# Patient Record
Sex: Male | Born: 2000 | Race: White | Hispanic: No | Marital: Single | Smoking: Never smoker
Health system: Southern US, Community
[De-identification: ages and names within clinical notes are randomized; demographics above are authoritative.]

## PROBLEM LIST (undated history)

## (undated) DIAGNOSIS — K5190 Ulcerative colitis, unspecified, without complications: Secondary | ICD-10-CM

## (undated) DIAGNOSIS — J45909 Unspecified asthma, uncomplicated: Secondary | ICD-10-CM

---
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
## 2021-06-07 ENCOUNTER — Encounter (HOSPITAL_COMMUNITY): Payer: Self-pay | Admitting: Emergency Medicine

## 2021-06-07 ENCOUNTER — Emergency Department (HOSPITAL_COMMUNITY)
Admission: EM | Admit: 2021-06-07 | Discharge: 2021-06-07 | Disposition: A | Discharge status: Home / Self Care | Payer: BC Managed Care – PPO | Attending: Emergency Medicine | Admitting: Emergency Medicine

## 2021-06-07 DIAGNOSIS — E860 Dehydration: Secondary | ICD-10-CM | POA: Diagnosis not present

## 2021-06-07 DIAGNOSIS — R109 Unspecified abdominal pain: Secondary | ICD-10-CM

## 2021-06-07 DIAGNOSIS — R1084 Generalized abdominal pain: Secondary | ICD-10-CM | POA: Insufficient documentation

## 2021-06-07 DIAGNOSIS — Z20822 Contact with and (suspected) exposure to covid-19: Secondary | ICD-10-CM | POA: Diagnosis not present

## 2021-06-07 DIAGNOSIS — R112 Nausea with vomiting, unspecified: Secondary | ICD-10-CM | POA: Insufficient documentation

## 2021-06-07 DIAGNOSIS — R197 Diarrhea, unspecified: Secondary | ICD-10-CM | POA: Diagnosis not present

## 2021-06-07 HISTORY — DX: Ulcerative colitis, unspecified, without complications: K51.90

## 2021-06-07 LAB — COMPREHENSIVE METABOLIC PANEL
ALT: 12 U/L (ref 0–44)
AST: 19 U/L (ref 15–41)
Albumin: 4.7 g/dL (ref 3.5–5.0)
Alkaline Phosphatase: 88 U/L (ref 38–126)
Anion gap: 8 (ref 5–15)
BUN: 11 mg/dL (ref 6–20)
CO2: 25 mmol/L (ref 22–32)
Calcium: 9.3 mg/dL (ref 8.9–10.3)
Chloride: 104 mmol/L (ref 98–111)
Creatinine, Ser: 1.09 mg/dL (ref 0.61–1.24)
GFR, Estimated: >60 mL/min (ref >60)
Glucose, Bld: 116 mg/dL — ABNORMAL HIGH (ref 70–99)
Potassium: 4 mmol/L (ref 3.5–5.1)
Sodium: 137 mmol/L (ref 135–145)
Total Bilirubin: 1.7 mg/dL — ABNORMAL HIGH (ref 0.3–1.2)
Total Protein: 7.9 g/dL (ref 6.5–8.1)

## 2021-06-07 LAB — CBC
HCT: 43.2 % (ref 39.0–52.0)
Hemoglobin: 15.4 g/dL (ref 13.0–17.0)
MCH: 29.2 pg (ref 26.0–34.0)
MCHC: 35.6 g/dL (ref 30.0–36.0)
MCV: 81.8 fL (ref 80.0–100.0)
Platelets: 260 10*3/uL (ref 150–400)
RBC: 5.28 MIL/uL (ref 4.22–5.81)
RDW: 12.2 % (ref 11.5–15.5)
WBC: 7.2 10*3/uL (ref 4.0–10.5)
nRBC: 0 % (ref 0.0–0.2)

## 2021-06-07 LAB — URINALYSIS, ROUTINE W REFLEX MICROSCOPIC
Bilirubin Urine: NEGATIVE
Glucose, UA: NEGATIVE mg/dL
Hgb urine dipstick: NEGATIVE
Ketones, ur: 20 mg/dL — AB
Leukocytes,Ua: NEGATIVE
Nitrite: NEGATIVE
Protein, ur: 30 mg/dL — AB
Specific Gravity, Urine: 1.031 — ABNORMAL HIGH (ref 1.005–1.030)
pH: 5 (ref 5.0–8.0)

## 2021-06-07 LAB — RESP PANEL BY RT-PCR (FLU A&B, COVID) ARPGX2
Influenza A by PCR: NEGATIVE
Influenza B by PCR: NEGATIVE
SARS Coronavirus 2 by RT PCR: NEGATIVE

## 2021-06-07 LAB — LIPASE, BLOOD: Lipase: 25 U/L (ref 11–51)

## 2021-06-07 MED ORDER — SODIUM CHLORIDE 0.9 % IV BOLUS
1000.0000 mL | Freq: Once | INTRAVENOUS | Status: AC
  Administered 2021-06-07: 1000 mL via INTRAVENOUS

## 2021-06-07 MED ORDER — ALUM & MAG HYDROXIDE-SIMETH 200-200-20 MG/5ML PO SUSP
30.0000 mL | Freq: Once | ORAL | Status: AC
  Administered 2021-06-07: 30 mL via ORAL
  Filled 2021-06-07: qty 30

## 2021-06-07 MED ORDER — FAMOTIDINE IN NACL 20-0.9 MG/50ML-% IV SOLN
20.0000 mg | Freq: Once | INTRAVENOUS | Status: AC
  Administered 2021-06-07: 20 mg via INTRAVENOUS
  Filled 2021-06-07: qty 50

## 2021-06-07 MED ORDER — ONDANSETRON HCL 4 MG/2ML IJ SOLN
4.0000 mg | Freq: Once | INTRAMUSCULAR | Status: AC
Start: 2021-06-07 — End: 2021-06-07
  Administered 2021-06-07: 4 mg via INTRAVENOUS
  Filled 2021-06-07: qty 2

## 2021-06-07 MED ORDER — LACTATED RINGERS IV BOLUS
1000.0000 mL | Freq: Once | INTRAVENOUS | Status: AC
  Administered 2021-06-07: 1000 mL via INTRAVENOUS

## 2021-06-07 MED ORDER — LIDOCAINE VISCOUS HCL 2 % MT SOLN
15.0000 mL | Freq: Once | OROMUCOSAL | Status: AC
  Administered 2021-06-07: 15 mL via ORAL
  Filled 2021-06-07: qty 15

## 2021-06-07 MED ORDER — SUCRALFATE 1 G PO TABS: 1.0000 g | ORAL_TABLET | Freq: Three times a day (TID) | ORAL | 0 refills | Status: DC

## 2021-06-07 MED ORDER — KETOROLAC TROMETHAMINE 15 MG/ML IJ SOLN
15.0000 mg | Freq: Once | INTRAMUSCULAR | Status: AC
  Administered 2021-06-07: 15 mg via INTRAVENOUS
  Filled 2021-06-07: qty 1

## 2021-06-07 MED ORDER — ONDANSETRON HCL 4 MG/2ML IJ SOLN
4.0000 mg | Freq: Once | INTRAMUSCULAR | Status: AC
  Administered 2021-06-07: 4 mg via INTRAVENOUS
  Filled 2021-06-07: qty 2

## 2021-06-07 MED ORDER — ONDANSETRON HCL 4 MG PO TABS: 4.0000 mg | ORAL_TABLET | ORAL | 0 refills | Status: DC | PRN

## 2021-06-07 NOTE — ED Provider Triage Note (Signed)
Emergency Medicine Provider Triage Evaluation Note  Gregory Ruiz , a 21 y.o. male  was evaluated in triage.  Pt complains of rectal bleeding over the last several days.  Patient does have a history of ulcerative colitis and is currently on mesalamine.  He was instructed to increase his dosage over the weekend and started prednisone which she took this morning but vomited back up.  He is having abdominal pain and bright red diarrhea.  Review of Systems  Positive:  Negative: See above   Physical Exam  BP 137/72 (BP Location: Left Arm)    Pulse 93    Temp 98.6 F (37 C) (Oral)    Resp 16    Ht 5\' 7"  (1.702 m)    Wt 59 kg    SpO2 100%    BMI 20.36 kg/m  Gen:   Awake, no distress   Resp:  Normal effort  MSK:   Moves extremities without difficulty  Other:    Medical Decision Making  Medically screening exam initiated at 11:57 AM.  Appropriate orders placed.  Gregory Ruiz was informed that the remainder of the evaluation will be completed by another provider, this initial triage assessment does not replace that evaluation, and the importance of remaining in the ED until their evaluation is complete.     Myna Bright [place], [place] 06/07/21 1159

## 2021-06-07 NOTE — ED Triage Notes (Signed)
Per patient, history of Ulcerative Colitis-states he was placed on new med but unable to take due to vomiting-states flare up since this past Friday

## 2021-06-07 NOTE — Discharge Instructions (Addendum)
It was a pleasure caring for you today in the emergency department.  Please return to the emergency department for any worsening or worrisome symptoms.   You should return to the hospital if you experience return of persistent nausea and vomiting that does not resolve and does not allow you to tolerate any food or fluids, persistent fevers for greater than 2-3 more days, increasing abdominal pain that persists despite medications, persistent diarrhea, dizziness, syncope (fainting), or for any other concerns.    Please return to the emergency department immediately for any new or concerning symptoms, or if you get worse.   

## 2021-06-07 NOTE — ED Provider Notes (Signed)
[facility] DEPT Provider Note   CSN: [id number] Arrival date & time: 06/07/21  1108     History  Chief Complaint  Patient presents with   Diarrhea   Emesis    Sparrow [place] is a 21 y.o. male.  This is a 21 y.o. male  with significant medical history as below, including UC who presents to the ED with complaint of abdominal pain, nausea, diarrhea, blood per rectum.  Poor P.o.  Location:  generalized pain Duration:  5 days Onset:  gradual Timing:  constant Description:  cramping abd pain Severity:  mild Exacerbating/Alleviating Factors:  worse when he attempts PO Associated Symptoms:  see above Pertinent Negatives:  no fevers or chills, cp or dib, no urine changes, no THC, no etoh  Context: diagnosed with UC around 3-4 mos ago with GI, started on po steroids at onset of symptoms. Unable to take this am 2/2 emesis     Past Medical History: No date: Ulcerative colitis ([place])  History reviewed. No pertinent surgical history.    The history is provided by the patient and a relative. No language interpreter was used.  Diarrhea Associated symptoms: abdominal pain and vomiting   Associated symptoms: no chills, no fever and no headaches   Emesis Associated symptoms: abdominal pain and diarrhea   Associated symptoms: no chills, no cough, no fever and no headaches       Home Medications Prior to Admission medications   Medication Sig Start Date End Date Taking? Authorizing Provider  ondansetron (ZOFRAN) 4 MG tablet Take 1 tablet (4 mg total) by mouth every 4 (four) hours as needed for nausea or vomiting. 06/07/21  Yes Wynona Dove A, DO  sucralfate (CARAFATE) 1 g tablet Take 1 tablet (1 g total) by mouth 4 (four) times daily -  with meals and at bedtime for 7 days. 06/07/21 06/14/21 Yes Jeanell Sparrow, DO      Allergies    Patient has no allergy information on record.    Review of Systems   Review of Systems  Constitutional:  Negative for chills  and fever.  HENT:  Negative for facial swelling and trouble swallowing.   Eyes:  Negative for photophobia and visual disturbance.  Respiratory:  Negative for cough and shortness of breath.   Cardiovascular:  Negative for chest pain and palpitations.  Gastrointestinal:  Positive for abdominal pain, blood in stool, diarrhea, nausea and vomiting.  Endocrine: Negative for polydipsia and polyuria.  Genitourinary:  Negative for difficulty urinating and hematuria.  Musculoskeletal:  Negative for gait problem and joint swelling.  Skin:  Negative for pallor and rash.  Neurological:  Negative for syncope and headaches.  Psychiatric/Behavioral:  Negative for agitation and confusion.    Physical Exam Updated Vital Signs BP 103/65    Pulse 86    Temp 98.3 F (36.8 C) (Oral)    Resp 18    Ht 5\' 7"  (1.702 m)    Wt 59 kg    SpO2 98%    BMI 20.36 kg/m  Physical Exam Vitals and nursing note reviewed.  Constitutional:      General: He is not in acute distress.    Appearance: Normal appearance. He is well-developed. He is not ill-appearing, toxic-appearing or diaphoretic.  HENT:     Head: Normocephalic and atraumatic.     Right Ear: External ear normal.     Left Ear: External ear normal.     Mouth/Throat:     Mouth: Mucous membranes are moist.  Eyes:     General: No scleral icterus. Cardiovascular:     Rate and Rhythm: Normal rate and regular rhythm.     Pulses: Normal pulses.     Heart sounds: Normal heart sounds.  Pulmonary:     Effort: Pulmonary effort is normal. No respiratory distress.     Breath sounds: Normal breath sounds.  Abdominal:     General: Abdomen is flat.     Palpations: Abdomen is soft.     Tenderness: There is no abdominal tenderness.  Musculoskeletal:        General: Normal range of motion.     Cervical back: Normal range of motion.     Right lower leg: No edema.     Left lower leg: No edema.  Skin:    General: Skin is warm and dry.     Capillary Refill: Capillary  refill takes less than 2 seconds.     Comments: Appropriate skin turgor   Neurological:     Mental Status: He is alert and oriented to person, place, and time.     GCS: GCS eye subscore is 4. GCS verbal subscore is 5. GCS motor subscore is 6.  Psychiatric:        Mood and Affect: Mood normal.        Behavior: Behavior normal.    ED Results / Procedures / Treatments   Labs (all labs ordered are listed, but only abnormal results are displayed) Labs Reviewed  COMPREHENSIVE METABOLIC PANEL - Abnormal; Notable for the following components:      Result Value   Glucose, Bld 116 (*)    Total Bilirubin 1.7 (*)    All other components within normal limits  URINALYSIS, ROUTINE W REFLEX MICROSCOPIC - Abnormal; Notable for the following components:   Specific Gravity, Urine 1.031 (*)    Ketones, ur 20 (*)    Protein, ur 30 (*)    Bacteria, UA RARE (*)    All other components within normal limits  RESP PANEL BY RT-PCR (FLU A&B, COVID) ARPGX2  LIPASE, BLOOD  CBC    EKG None  Radiology No results found.  Procedures .Critical Care Performed by: Jeanell Sparrow, DO Authorized by: Jeanell Sparrow, DO   Critical care provider statement:    Critical care time (minutes):  30   Critical care time was exclusive of:  Separately billable procedures and treating other patients   Critical care was necessary to treat or prevent imminent or life-threatening deterioration of the following conditions:  Dehydration   Critical care was time spent personally by me on the following activities:  Development of treatment plan with patient or surrogate, discussions with consultants, evaluation of patient's response to treatment, examination of patient, ordering and review of laboratory studies, ordering and review of radiographic studies, ordering and performing treatments and interventions, pulse oximetry, re-evaluation of patient's condition, review of old charts and obtaining history from patient or surrogate     Medications Ordered in ED Medications  sodium chloride 0.9 % bolus 1,000 mL (0 mLs Intravenous Stopped 06/07/21 1802)  ondansetron (ZOFRAN) injection 4 mg (4 mg Intravenous Given 06/07/21 1651)  famotidine (PEPCID) IVPB 20 mg premix (0 mg Intravenous Stopped 06/07/21 1802)  alum & mag hydroxide-simeth (MAALOX/MYLANTA) 200-200-20 MG/5ML suspension 30 mL (30 mLs Oral Given 06/07/21 1648)    And  lidocaine (XYLOCAINE) 2 % viscous mouth solution 15 mL (15 mLs Oral Given 06/07/21 1648)  ketorolac (TORADOL) 15 MG/ML injection 15 mg (15 mg Intravenous Given  06/07/21 1650)  ondansetron (ZOFRAN) injection 4 mg (4 mg Intravenous Given 06/07/21 2136)  lactated ringers bolus 1,000 mL (0 mLs Intravenous Stopped 06/07/21 2304)    ED Course/ Medical Decision Making/ A&P                           Medical Decision Making Amount and/or Complexity of Data Reviewed Labs: ordered.  Risk OTC drugs. Prescription drug management.    CC: abd pain, diarrhea, n/v  This patient presents to the Emergency Department for the above complaint. This involves an extensive number of treatment options and is a complaint that carries with it a high risk of complications and morbidity. Vital signs were reviewed. Serious etiologies considered.  Abd is soft, non-tender, not peritoneal.   Record review:  Previous records obtained and reviewed   Additional history obtained from mother  Medical and surgical history as noted above.   Work up as above, notable for:   Lab results that were available during my care of the patient were reviewed by me and considered in my medical decision making.   Cardiac monitoring reviewed and interpreted personally which shows NSR  Social determinants of health include - N/a  Management: Gi cocktail, IVF  Reassessment:  Feeling better, tolerating PO. Rpt abd exam is soft, non-tender.    Patient presents with vomiting, diarrhea, and abdominal cramping. Sx suggestive of enteritis or  food born illness vs flare of underling UC. Surgical or other more serious etiology appears very unlikely. The patient is improved with ED treatment. Will discharge with observation and symptomatic treatment. Abdominal pain warnings discussed.  F/u with PCP and GI  The patient improved significantly and was discharged in stable condition. Detailed discussions were had with the patient regarding current findings, and need for close f/u with PCP or on call doctor. The patient has been instructed to return immediately if the symptoms worsen in any way for re-evaluation. Patient verbalized understanding and is in agreement with current care plan. All questions answered prior to discharge.      This chart was dictated using voice recognition software.  Despite best efforts to proofread,  errors can occur which can change the documentation meaning.         Final Clinical Impression(s) / ED Diagnoses Final diagnoses:  Dehydration  Nausea vomiting and diarrhea  Abdominal pain, unspecified abdominal location    Rx / DC Orders ED Discharge Orders          Ordered    sucralfate (CARAFATE) 1 g tablet  3 times daily with meals & bedtime        06/07/21 2213    ondansetron (ZOFRAN) 4 MG tablet  Every 4 hours PRN        06/07/21 2213              Wynona Dove A, DO 06/08/21 0100

## 2021-06-10 ENCOUNTER — Other Ambulatory Visit: Payer: Self-pay | Admitting: Gastroenterology

## 2021-06-10 ENCOUNTER — Ambulatory Visit
Admission: RE | Admit: 2021-06-10 | Discharge: 2021-06-10 | Disposition: A | Discharge status: Home / Self Care | Payer: BC Managed Care – PPO | Source: Ambulatory Visit | Attending: Gastroenterology | Admitting: Gastroenterology

## 2021-06-10 DIAGNOSIS — R110 Nausea: Secondary | ICD-10-CM

## 2021-06-11 ENCOUNTER — Other Ambulatory Visit: Payer: Self-pay

## 2021-06-11 ENCOUNTER — Emergency Department (HOSPITAL_COMMUNITY): Payer: BC Managed Care – PPO

## 2021-06-11 ENCOUNTER — Inpatient Hospital Stay (HOSPITAL_COMMUNITY)
Admission: EM | Admit: 2021-06-11 | Discharge: 2021-06-24 | DRG: 386 | Disposition: A | Discharge status: Home / Self Care | Payer: BC Managed Care – PPO | Attending: Family Medicine | Admitting: Family Medicine

## 2021-06-11 ENCOUNTER — Encounter (HOSPITAL_COMMUNITY): Payer: Self-pay

## 2021-06-11 DIAGNOSIS — Z880 Allergy status to penicillin: Secondary | ICD-10-CM

## 2021-06-11 DIAGNOSIS — R300 Dysuria: Secondary | ICD-10-CM

## 2021-06-11 DIAGNOSIS — Z79899 Other long term (current) drug therapy: Secondary | ICD-10-CM

## 2021-06-11 DIAGNOSIS — N3090 Cystitis, unspecified without hematuria: Secondary | ICD-10-CM

## 2021-06-11 DIAGNOSIS — K5190 Ulcerative colitis, unspecified, without complications: Secondary | ICD-10-CM | POA: Diagnosis present

## 2021-06-11 DIAGNOSIS — R000 Tachycardia, unspecified: Secondary | ICD-10-CM

## 2021-06-11 DIAGNOSIS — E860 Dehydration: Secondary | ICD-10-CM | POA: Diagnosis present

## 2021-06-11 DIAGNOSIS — K51819 Other ulcerative colitis with unspecified complications: Secondary | ICD-10-CM

## 2021-06-11 DIAGNOSIS — J4520 Mild intermittent asthma, uncomplicated: Secondary | ICD-10-CM | POA: Diagnosis present

## 2021-06-11 DIAGNOSIS — Z7952 Long term (current) use of systemic steroids: Secondary | ICD-10-CM

## 2021-06-11 DIAGNOSIS — Z20822 Contact with and (suspected) exposure to covid-19: Secondary | ICD-10-CM | POA: Diagnosis present

## 2021-06-11 DIAGNOSIS — D500 Iron deficiency anemia secondary to blood loss (chronic): Secondary | ICD-10-CM

## 2021-06-11 DIAGNOSIS — K51011 Ulcerative (chronic) pancolitis with rectal bleeding: Principal | ICD-10-CM | POA: Diagnosis present

## 2021-06-11 DIAGNOSIS — Z111 Encounter for screening for respiratory tuberculosis: Secondary | ICD-10-CM

## 2021-06-11 DIAGNOSIS — J45909 Unspecified asthma, uncomplicated: Secondary | ICD-10-CM

## 2021-06-11 DIAGNOSIS — R17 Unspecified jaundice: Secondary | ICD-10-CM | POA: Diagnosis present

## 2021-06-11 HISTORY — DX: Unspecified asthma, uncomplicated: J45.909

## 2021-06-11 LAB — COMPREHENSIVE METABOLIC PANEL
ALT: 12 U/L (ref 0–44)
AST: 14 U/L — ABNORMAL LOW (ref 15–41)
Albumin: 4.3 g/dL (ref 3.5–5.0)
Alkaline Phosphatase: 72 U/L (ref 38–126)
Anion gap: 9 (ref 5–15)
BUN: 15 mg/dL (ref 6–20)
CO2: 28 mmol/L (ref 22–32)
Calcium: 9.3 mg/dL (ref 8.9–10.3)
Chloride: 103 mmol/L (ref 98–111)
Creatinine, Ser: 1.15 mg/dL (ref 0.61–1.24)
GFR, Estimated: >60 mL/min (ref >60)
Glucose, Bld: 98 mg/dL (ref 70–99)
Potassium: 3.6 mmol/L (ref 3.5–5.1)
Sodium: 140 mmol/L (ref 135–145)
Total Bilirubin: 1.6 mg/dL — ABNORMAL HIGH (ref 0.3–1.2)
Total Protein: 7.5 g/dL (ref 6.5–8.1)

## 2021-06-11 LAB — URINALYSIS, ROUTINE W REFLEX MICROSCOPIC
Bilirubin Urine: NEGATIVE
Glucose, UA: NEGATIVE mg/dL
Hgb urine dipstick: NEGATIVE
Ketones, ur: NEGATIVE mg/dL
Leukocytes,Ua: NEGATIVE
Nitrite: NEGATIVE
Protein, ur: NEGATIVE mg/dL
Specific Gravity, Urine: 1.01 (ref 1.005–1.030)
pH: 6 (ref 5.0–8.0)

## 2021-06-11 LAB — CBC WITH DIFFERENTIAL/PLATELET
Abs Immature Granulocytes: 0.03 10*3/uL (ref 0.00–0.07)
Basophils Absolute: 0 10*3/uL (ref 0.0–0.1)
Basophils Relative: 1 %
Eosinophils Absolute: 0.1 10*3/uL (ref 0.0–0.5)
Eosinophils Relative: 2 %
HCT: 43.5 % (ref 39.0–52.0)
Hemoglobin: 15.3 g/dL (ref 13.0–17.0)
Immature Granulocytes: 0 %
Lymphocytes Relative: 11 %
Lymphs Abs: 0.9 10*3/uL (ref 0.7–4.0)
MCH: 29 pg (ref 26.0–34.0)
MCHC: 35.2 g/dL (ref 30.0–36.0)
MCV: 82.5 fL (ref 80.0–100.0)
Monocytes Absolute: 1.3 10*3/uL — ABNORMAL HIGH (ref 0.1–1.0)
Monocytes Relative: 15 %
Neutro Abs: 5.9 10*3/uL (ref 1.7–7.7)
Neutrophils Relative %: 71 %
Platelets: 289 10*3/uL (ref 150–400)
RBC: 5.27 MIL/uL (ref 4.22–5.81)
RDW: 12.3 % (ref 11.5–15.5)
WBC: 8.3 10*3/uL (ref 4.0–10.5)
nRBC: 0 % (ref 0.0–0.2)

## 2021-06-11 LAB — RESP PANEL BY RT-PCR (FLU A&B, COVID) ARPGX2
Influenza A by PCR: NEGATIVE
Influenza B by PCR: NEGATIVE
SARS Coronavirus 2 by RT PCR: NEGATIVE

## 2021-06-11 LAB — C DIFFICILE QUICK SCREEN W PCR REFLEX
C Diff antigen: NEGATIVE
C Diff interpretation: NOT DETECTED
C Diff toxin: NEGATIVE

## 2021-06-11 LAB — LIPASE, BLOOD: Lipase: 26 U/L (ref 11–51)

## 2021-06-11 LAB — HEPATITIS B SURFACE ANTIGEN: Hepatitis B Surface Ag: NONREACTIVE

## 2021-06-11 MED ORDER — ACETAMINOPHEN 650 MG RE SUPP: 650.0000 mg | Freq: Four times a day (QID) | RECTAL | Status: DC | PRN

## 2021-06-11 MED ORDER — METHYLPREDNISOLONE SODIUM SUCC 40 MG IJ SOLR
40.0000 mg | Freq: Two times a day (BID) | INTRAMUSCULAR | Status: DC
  Administered 2021-06-11 – 2021-06-14 (×6): 40 mg via INTRAVENOUS
  Filled 2021-06-11 (×6): qty 1

## 2021-06-11 MED ORDER — SUCRALFATE 1 G PO TABS
1.0000 g | ORAL_TABLET | Freq: Three times a day (TID) | ORAL | Status: DC
  Administered 2021-06-11 – 2021-06-12 (×4): 1 g via ORAL
  Filled 2021-06-11 (×4): qty 1

## 2021-06-11 MED ORDER — LACTATED RINGERS IV SOLN: INTRAVENOUS | Status: DC

## 2021-06-11 MED ORDER — ONDANSETRON 4 MG PO TBDP
4.0000 mg | ORAL_TABLET | Freq: Once | ORAL | Status: AC
  Administered 2021-06-11: 4 mg via ORAL
  Filled 2021-06-11: qty 1

## 2021-06-11 MED ORDER — SODIUM CHLORIDE (PF) 0.9 % IJ SOLN
INTRAMUSCULAR | Status: AC
  Filled 2021-06-11: qty 50

## 2021-06-11 MED ORDER — SODIUM CHLORIDE 0.9 % IV SOLN
1.0000 g | Freq: Once | INTRAVENOUS | Status: AC
  Administered 2021-06-11: 1 g via INTRAVENOUS
  Filled 2021-06-11: qty 10

## 2021-06-11 MED ORDER — MESALAMINE 1.2 G PO TBEC
2.4000 g | DELAYED_RELEASE_TABLET | Freq: Two times a day (BID) | ORAL | Status: DC
  Administered 2021-06-11 – 2021-06-17 (×11): 2.4 g via ORAL
  Filled 2021-06-11 (×14): qty 2

## 2021-06-11 MED ORDER — POTASSIUM CHLORIDE CRYS ER 20 MEQ PO TBCR
40.0000 meq | EXTENDED_RELEASE_TABLET | Freq: Once | ORAL | Status: AC
  Administered 2021-06-11: 40 meq via ORAL
  Filled 2021-06-11: qty 2

## 2021-06-11 MED ORDER — IOHEXOL 300 MG/ML  SOLN
100.0000 mL | Freq: Once | INTRAMUSCULAR | Status: AC | PRN
  Administered 2021-06-11: 100 mL via INTRAVENOUS

## 2021-06-11 MED ORDER — ONDANSETRON HCL 4 MG/2ML IJ SOLN
4.0000 mg | Freq: Four times a day (QID) | INTRAMUSCULAR | Status: DC | PRN
  Administered 2021-06-12 – 2021-06-16 (×13): 4 mg via INTRAVENOUS
  Filled 2021-06-11 (×14): qty 2

## 2021-06-11 MED ORDER — SODIUM CHLORIDE 0.9 % IV SOLN
1.0000 g | INTRAVENOUS | Status: DC
  Administered 2021-06-12 – 2021-06-13 (×2): 1 g via INTRAVENOUS
  Filled 2021-06-11 (×3): qty 10

## 2021-06-11 MED ORDER — ENOXAPARIN SODIUM 40 MG/0.4ML IJ SOSY
40.0000 mg | PREFILLED_SYRINGE | INTRAMUSCULAR | Status: DC
  Filled 2021-06-11 (×2): qty 0.4

## 2021-06-11 MED ORDER — ACETAMINOPHEN 325 MG PO TABS: 650.0000 mg | ORAL_TABLET | Freq: Four times a day (QID) | ORAL | Status: DC | PRN

## 2021-06-11 MED ORDER — ONDANSETRON HCL 4 MG PO TABS: 4.0000 mg | ORAL_TABLET | Freq: Four times a day (QID) | ORAL | Status: DC | PRN

## 2021-06-11 MED ORDER — SODIUM CHLORIDE 0.9 % IV BOLUS
1000.0000 mL | Freq: Once | INTRAVENOUS | Status: AC
Start: 2021-06-11 — End: 2021-06-11
  Administered 2021-06-11: 1000 mL via INTRAVENOUS

## 2021-06-11 MED ORDER — ALBUTEROL SULFATE (2.5 MG/3ML) 0.083% IN NEBU: 2.5000 mg | INHALATION_SOLUTION | RESPIRATORY_TRACT | Status: DC | PRN

## 2021-06-11 MED ORDER — ENSURE ENLIVE PO LIQD
237.0000 mL | Freq: Two times a day (BID) | ORAL | Status: DC
  Administered 2021-06-12 (×2): 237 mL via ORAL

## 2021-06-11 NOTE — Assessment & Plan Note (Deleted)
He reports dysuria, and small amount of purulent drainage from penis/ UA; no WBC.  CT scan: Mild diffuse urinary bladder wall thickening. Follow urine culture, GC chlamydia and gonorrhea. Continue with IV ceftriaxone. Urinary symptoms improved.

## 2021-06-11 NOTE — Assessment & Plan Note (Addendum)
Some fluctuation, trend Follow outpatient

## 2021-06-11 NOTE — Assessment & Plan Note (Addendum)
Presented with UC flare CT with long segment bowe wall thickening with mucosal hyperenhancement involving the distal transverse colon, descending colon, sigmoid colon and rectum Negative c diff and GI pathogen panel from 2/11 Colonoscopy 2/17 with severe pancolitis ulcerative colitis 2/17 bx with focal acute colitis, active chronic colitis with architectural changes c/w ulcerative colitis, active chronic colitis with architectural changes c/w ulcerative colitis Transitioned to oral steroids - will discharge with 40 mg daily at discharge per GI, long term dosing per Dr. [name] S/p first dose of infliximib on 2/23 Appreciate Gi assistance

## 2021-06-11 NOTE — Progress Notes (Signed)
Patient IV was started in CT for exam. Per Denton Ar RN leave iv in and place patient in lobby. Patient instructed to tell triage if leaves.

## 2021-06-11 NOTE — Consult Note (Addendum)
[facility] Consult  Referring Provider: ER Primary Care Physician:  [name], MD Primary Gastroenterologist: Dr. Fabio [facility] gastroenter[facility]  Reason for Consultation: Rectal bleeding, burning micturition, diarrhea, nausea  HPI: Gregory Ruiz is a 21 y.o. male diagnosed with ulcerative colitis, 5/22, colonoscopy showed focal active colitis in cecum, mildly active chronic proctitis with a normal terminal ileum, ascending, transverse, descending and sigmoid. Patient was on mesalamine 1.2 g 2 pills a day since then with fair control of symptoms, however for the last 10 days has developed progressively worsening rectal bleeding, increased frequency of bowel movements, left-sided abdominal pain, nausea and painful/burning micturition. This is worsened, patient was not able to tolerate oral prednisone because of nausea and vomiting and noticed foul-smelling puslike discharge with urination. He was subsequently advised to proceed to ER where he had a CAT scan of the abdomen and pelvis with contrast which showed long segment bowel wall thickening with mucosal hyperenhancement-involving distal transverse, descending, sigmoid, rectum, without obstruction, associated with mild diffuse urinary bladder wall thickening representing cystitis. There was no obvious colovesical fistula. Stool studies negative for C. difficile while GI pathogen panel is pending.   Past Medical History:  Diagnosis Date   Asthma    Ulcerative colitis ([place])     History reviewed. No pertinent surgical history.  Prior to Admission medications   Medication Sig Start Date End Date Taking? Authorizing Provider  ondansetron (ZOFRAN) 4 MG tablet Take 1 tablet (4 mg total) by mouth every 4 (four) hours as needed for nausea or vomiting. 06/07/21   Jeanell Sparrow, DO  sucralfate (CARAFATE) 1 g tablet Take 1 tablet (1 g total) by mouth 4 (four) times daily -  with meals and at bedtime for 7 days. 06/07/21 06/14/21  Jeanell Sparrow, DO    Current Facility-Administered Medications  Medication Dose Route Frequency Provider Last Rate Last Admin   cefTRIAXone (ROCEPHIN) 1 g in sodium chloride 0.9 % 100 mL IVPB  1 g Intravenous Once Couture, Cortni S, PA-C       sodium chloride (PF) 0.9 % injection            Current Outpatient Medications  Medication Sig Dispense Refill   ondansetron (ZOFRAN) 4 MG tablet Take 1 tablet (4 mg total) by mouth every 4 (four) hours as needed for nausea or vomiting. 8 tablet 0   sucralfate (CARAFATE) 1 g tablet Take 1 tablet (1 g total) by mouth 4 (four) times daily -  with meals and at bedtime for 7 days. 28 tablet 0    Allergies as of 06/11/2021 - Review Complete 06/11/2021  Allergen Reaction Noted   Penicillins Rash 02/10/2015   Other Rash 02/10/2015    History reviewed. No pertinent family history.  Social History   Socioeconomic History   Marital status: Single    Spouse name: Not on file   Number of children: Not on file   Years of education: Not on file   Highest education level: Not on file  Occupational History   Not on file  Tobacco Use   Smoking status: Not on file   Smokeless tobacco: Not on file  Substance and Sexual Activity   Alcohol use: Not on file   Drug use: Not on file   Sexual activity: Not on file  Other Topics Concern   Not on file  Social History Narrative   Not on file   Social Determinants of Health   Financial Resource Strain: Not on file  Food Insecurity: Not on  file  Transportation Needs: Not on file  Physical Activity: Not on file  Stress: Not on file  Social Connections: Not on file  Intimate Partner Violence: Not on file    Review of Systems: Positive for: GI: Described in detail in HPI.    Gen: anorexia, fatigue, weakness, involuntary weight loss, denies any fever, chills, rigors, night sweats  and sleep disorder CV: Denies chest pain, angina, palpitations, syncope, orthopnea, PND, peripheral edema, and  claudication. Resp: Denies dyspnea, cough, sputum, wheezing, coughing up blood. GU : urinary burning, urinary frequency, urinary hesitancy, nocturnal urinationblood in urine, denies urinary incontinence. MS: Denies joint pain or swelling.  Denies muscle weakness, cramps, atrophy.  Derm: Denies rash, itching, oral ulcerations, hives, unhealing ulcers.  Psych: Denies depression, anxiety, memory loss, suicidal ideation, hallucinations,  and confusion. Heme: Denies bruising and enlarged lymph nodes. Neuro:  Denies any headaches, dizziness, paresthesias. Endo:  Denies any problems with DM, thyroid, adrenal function.  Physical Exam: Vital signs in last 24 hours: Temp:  [98.7 F (37.1 C)] 98.7 F (37.1 C) (02/11 0857) Pulse Rate:  [92-106] 106 (02/11 1340) Resp:  [16-18] 18 (02/11 1340) BP: (111-122)/(72-77) 114/72 (02/11 1340) SpO2:  [95 %-100 %] 99 % (02/11 1340)    General:   Alert,  Well-developed, thinly built, pleasant and cooperative in NAD Head:  Normocephalic and atraumatic. Eyes:  Sclera clear, no icterus.   Conjunctiva pink. Ears:  Normal auditory acuity. Nose:  No deformity, discharge,  or lesions. Mouth:  No deformity or lesions.  Oropharynx pink & moist. Neck:  Supple; no masses or thyromegaly. Lungs:  Clear throughout to auscultation.   No wheezes, crackles, or rhonchi. No acute distress. Heart:  Regular rate and rhythm; no murmurs, clicks, rubs,  or gallops. Extremities:  Without clubbing or edema. Neurologic:  Alert and  oriented x4;  grossly normal neurologically. Skin:  Intact without significant lesions or rashes. Psych:  Alert and cooperative. Normal mood and affect. Abdomen:  Soft, nontender and nondistended. No masses, hepatosplenomegaly or hernias noted. Normal bowel sounds, without guarding, and without rebound.         Lab Results: Recent Labs    06/11/21 0946  WBC 8.3  HGB 15.3  HCT 43.5  PLT 289   BMET Recent Labs    06/11/21 0946  NA 140  K  3.6  CL 103  CO2 28  GLUCOSE 98  BUN 15  CREATININE 1.15  CALCIUM 9.3   LFT Recent Labs    06/11/21 0946  PROT 7.5  ALBUMIN 4.3  AST 14*  ALT 12  ALKPHOS 72  BILITOT 1.6*   PT/INR No results for input(s): LABPROT, INR in the last 72 hours.  Studies/Results: CT ABDOMEN PELVIS W CONTRAST  Result Date: 06/11/2021 CLINICAL DATA:  Inflammatory bowel disease (IBD) concern for entero-vesical fistula EXAM: CT ABDOMEN AND PELVIS WITH CONTRAST TECHNIQUE: Multidetector CT imaging of the abdomen and pelvis was performed using the standard protocol following bolus administration of intravenous contrast. RADIATION DOSE REDUCTION: This exam was performed according to the departmental dose-optimization program which includes automated exposure control, adjustment of the mA and/or kV according to patient size and/or use of iterative reconstruction technique. CONTRAST:  140mL OMNIPAQUE IOHEXOL 300 MG/ML  SOLN COMPARISON:  12/15/2020 FINDINGS: Lower chest: Lung bases are clear.  Heart size is normal. Hepatobiliary: No focal liver abnormality is seen. No gallstones, gallbladder wall thickening, or biliary dilatation. Pancreas: Unremarkable. No pancreatic ductal dilatation or surrounding inflammatory changes. Spleen: Normal in size without  focal abnormality. Adrenals/Urinary Tract: Unremarkable adrenal glands. Kidneys enhance symmetrically without focal lesion, stone, or hydronephrosis. Ureters are nondilated. Mild diffuse urinary bladder wall thickening. No air is seen within the bladder lumen. No obvious colovesical fistula. Stomach/Bowel: Long segment bowel wall thickening with mucosal hyperenhancement involving the distal transverse colon, descending colon, sigmoid colon, and rectum. Normal appendix in the right lower quadrant. No dilated loops of bowel to suggest obstruction. Stomach within normal limits. Vascular/Lymphatic: No significant vascular findings are present. No enlarged abdominal or pelvic lymph  nodes. Reproductive: Prostate is unremarkable. Other: No free fluid. No abdominopelvic fluid collection. No pneumoperitoneum. No abdominal wall hernia. Musculoskeletal: No acute or significant osseous findings. IMPRESSION: 1. Long segment bowel wall thickening with mucosal hyperenhancement involving the distal transverse colon, descending colon, sigmoid colon, and rectum. Findings suggest active bowel inflammation in the setting of known inflammatory bowel disease. 2. Mild diffuse urinary bladder wall thickening, which may represent cystitis. No obvious colovesical fistula. Correlate with urinalysis. If further imaging evaluation is warranted, a CT pelvis with rectal contrast could be performed. Electronically Signed   By: Davina Poke D.O.   On: 06/11/2021 12:03    Impression: Ulcerative colitis Worsening rectal bleeding, increased frequency of bowel movements Burning micturition, bladder wall thickening suspicious for infectious cystitis  Plan: Recommend admission IV fluids at 125 mill per hour, lactated Ringer's IV Solu-Medrol 40 mg every 12 hours Urine analysis, urine culture, IV antibiotics accordingly Meanwhile, we will send hepatitis B surface antigen, TB gold and TPMT levels, if patient requires escalation of therapy for ulcerative colitis. Will resume mesalamine 1.2 g, to be taken 4 pills a day. We will start full liquid diet for now.   LOS: 0 days   Ronnette Juniper, MD  06/11/2021, 2:22 PM

## 2021-06-11 NOTE — ED Triage Notes (Signed)
Pt arrived via POV, c/o bright red blood in stools, and nausea, no appetite. Was seen recently for same, little relief. Now having dysuria and states he saw some blood and pus this morning as well.

## 2021-06-11 NOTE — Assessment & Plan Note (Addendum)
No  recent exacerbation.  As needed albuterol. Stable.

## 2021-06-11 NOTE — H&P (Addendum)
History and Physical    Patient: Gregory Ruiz EUM:[id number] DOB: August 04, 2000 DOA: 06/11/2021 DOS: the patient was seen and examined on 06/11/2021 PCP: [name], MD  Patient coming from: Home  Chief Complaint:  Chief Complaint  Patient presents with   Blood In Stools    HPI: Gregory Ruiz is a 21 y.o. male with medical history significant of asthma as a child, ulcerative colitis diagnosed 08/2020, on mesalamine, presents complaining of worsening abdominal pain, profuse diarrhea, more than 10 episodes per day.  Loose stools, subsequently  watery.  He also reports blood in the stool.  Reports cramping abdominal pain radiating to the side and back.  He was seen in the ED 5 days ago with similar symptoms, he was prescribed prednisone and Zofran.  The first 2 days he was able to tolerate oral prednisone due to vomiting.  Over the last 3 days he has been able to take 20 mg of prednisone.  He presented with worsening symptoms diarrhea, nausea.   He also reports dysuria, and 1 episode of small amount of purulent drainage from his penis.   Evaluation in the ED: Normal electrolytes: Sodium 140, potassium 3.6, creatinine 1.1, AST 14, ALT 12, bilirubin 1.6, hemoglobin 15, white blood cell 8.3, platelets 289, UA: Clear yellow negative protein, CDF negative.  GI pathogens pending.  CT abdomen and pelvis: Long segment bowel wall thickening with mucosal hyperenhancement involving the distal transverse colon, descending colon, sigmoid colon, and rectum. Findings suggest active bowel inflammation in the setting of known inflammatory bowel disease. Mild diffuse urinary bladder wall thickening, which may represent cystitis. No obvious colovesical fistula. Correlate with urinalysis. If further imaging evaluation is warranted, a CT pelvis with rectal contrast could be performed.  Review of Systems: As mentioned in the history of present illness. All other systems reviewed and are negative. Past Medical History:   Diagnosis Date   Asthma    Ulcerative colitis ([place])    History reviewed. No pertinent surgical history. Social History:  has no history on file for tobacco use, alcohol use, and drug use.  Allergies  Allergen Reactions   Penicillins Rash   Other Rash    Nuts - hazelnuts.    History reviewed. No pertinent family history.  Prior to Admission medications   Medication Sig Start Date End Date Taking? Authorizing Provider  cetirizine (ZYRTEC) 10 MG tablet Take 10 mg by mouth daily as needed for allergies.   Yes [provider]  mesalamine (LIALDA) 1.2 g EC tablet Take 2.4 g by mouth at bedtime. 06/05/21  Yes [provider]  ondansetron (ZOFRAN-ODT) 4 MG disintegrating tablet Take 4 mg by mouth every 4 (four) hours as needed for nausea or vomiting. 06/07/21  Yes [provider]  Pediatric Multiple Vitamins (FLINSTONES GUMMIES OMEGA-3 DHA) CHEW Chew 2 tablets by mouth daily.   Yes [provider]  predniSONE (DELTASONE) 20 MG tablet Take 20 mg by mouth daily with breakfast. 06/03/21  Yes [provider]  sucralfate (CARAFATE) 1 g tablet Take 1 tablet (1 g total) by mouth 4 (four) times daily -  with meals and at bedtime for 7 days. 06/07/21 06/14/21 Yes Wynona Dove A, DO  ondansetron (ZOFRAN) 4 MG tablet Take 1 tablet (4 mg total) by mouth every 4 (four) hours as needed for nausea or vomiting. Patient not taking: Reported on 06/11/2021 06/07/21   Jeanell Sparrow, DO    Physical Exam: Vitals:   06/11/21 0857 06/11/21 1149 06/11/21 1340  BP:  122/77 111/72 114/72  Pulse: 92 (!) 101 (!) 106  Resp: _0 Temp: 98.7 F (37.1 C)    TempSrc: Oral    SpO2: 100% 95% 99%   Genera; thin appearing, in no acute distress, pleasant.  CVS; S1, S2 regular rhythm and rate, no murmur or gallops, no JVD Lungs: Normal respiratory effort, clear to auscultation bilaterally Abdomen: Bowel sounds present, soft, nondistended, nontender Extremity: No  edema Genitourinary exam; penis with mild redness tip of urethra, no ulcer no redness no evidence of abscess.   Data Reviewed:  CBC, c-Met, CT scan abdomen review of results.  Assessment and Plan: * Ulcerative colitis ([place])- (present on admission) -Patient presenting with worsening abdominal pain, bloody stool, profuse diarrhea. -Patient with acute flare of ulcerative colitis. -CT abdomen and pelvis: Long  segment bowel wall thickening with mucosal hyperenhancement involving the distal transverse colon, descending colon sigmoid colon and rectum.  Findings suggest active bowel inflammation in the setting of known inflammatory bowel disease. -Appreciate Dr Therisa Doyne evaluation.  -Fail oral prednisone.  -Started IV solumedrol IV BID.  -Resume Mesalamine BID.  -Check hepatitis B surface antigen, TB gold and TPMT levels in case patient requires escalation of therapy for ulcerative colitis. -IV fluids.  -C. Diff negative , GI pathogen pending.   Asthma, chronic Patient denies any recent exacerbation.  We will order as needed albuterol  Dysuria He reports dysuria, and small amount of pus drainage from penis/ UA; no WBC.  CT scan: Mild diffuse urinary bladder wall thickening. Follow urine culture, GC chlamydia and gonorrhea. We will continue hold for now IV ceftriaxone  Hyperbilirubinemia Might be related to dehydration, Rosanna Randy diseases/        Advance Care Planning:   Code Status: Not on file Full code  Consults: Gastroenterologist, Dr. Therisa Doyne   DVT prophylaxis; Lovenox, no significant drop in hemoglobin.  He will benefit from DVT prophylaxis due to his history of ulcerative colitis  Family Communication: Care discussed with father who was at bedside.  Severity of Illness: The appropriate patient status for this patient is OBSERVATION. Observation status is judged to be reasonable and necessary in order to provide the required intensity of service to ensure the patient's safety.  The patient's presenting symptoms, physical exam findings, and initial radiographic and laboratory data in the context of their medical condition is felt to place them at decreased risk for further clinical deterioration. Furthermore, it is anticipated that the patient will be medically stable for discharge from the hospital within 2 midnights of admission.   Author: Elmarie Shiley, MD 06/11/2021 3:46 PM  For on call review [email or web address].

## 2021-06-11 NOTE — ED Provider Notes (Addendum)
[facility] DEPT Provider Note   CSN: [id number] Arrival date & time: 06/11/21  0850     History  Chief Complaint  Patient presents with   Blood In Stools    Gregory Ruiz is a 21 y.o. male.  HPI   Pt is a 21 y/o male with a h/o ulcerative proctitis who presents to hte ED today c/o rectal bleeding, fatigue that started about 1 week ago. Seen in the ED on 06/07/2021. He was started on prednisone by GI and has been on this for several days with no improvement. He reports he has had decreased Po intake, nausea, diarrhea, vomiting (currently resolved), dysuria, purulent drainage from the penis, and malodorous urine.  Denies frequency, urgency, hematuria, fevers. Denies melena and abd pain. Has not been sexually active in the last 12 months and denies concern for STD.  Mom at bedside states that she spoke with Dr. Therisa Doyne who recommended coming to the ED as there was concern he may need advanced imaging. They also recommended a stool studies.   GI: Dr. Rosalie Gums [facility])  Home Medications Prior to Admission medications   Medication Sig Start Date End Date Taking? Authorizing Provider  cetirizine (ZYRTEC) 10 MG tablet Take 10 mg by mouth daily as needed for allergies.   Yes [provider]  mesalamine (LIALDA) 1.2 g EC tablet Take 2.4 g by mouth at bedtime. 06/05/21  Yes [provider]  ondansetron (ZOFRAN-ODT) 4 MG disintegrating tablet Take 4 mg by mouth every 4 (four) hours as needed for nausea or vomiting. 06/07/21  Yes [provider]  Pediatric Multiple Vitamins (FLINSTONES GUMMIES OMEGA-3 DHA) CHEW Chew 2 tablets by mouth daily.   Yes [provider]  predniSONE (DELTASONE) 20 MG tablet Take 20 mg by mouth daily with breakfast. 06/03/21  Yes [provider]  sucralfate (CARAFATE) 1 g tablet Take 1 tablet (1 g total) by mouth 4 (four) times daily -  with meals and at bedtime for 7 days. 06/07/21 06/14/21 Yes Wynona Dove  A, DO  ondansetron (ZOFRAN) 4 MG tablet Take 1 tablet (4 mg total) by mouth every 4 (four) hours as needed for nausea or vomiting. Patient not taking: Reported on 06/11/2021 06/07/21   Wynona Dove A, DO      Allergies    Penicillins and Other    Review of Systems   Review of Systems See HPI for pertinent positives or negatives.   Physical Exam Updated Vital Signs BP 114/72    Pulse (!) 106    Temp 98.7 F (37.1 C) (Oral)    Resp 18    SpO2 99%  Physical Exam Vitals and nursing note reviewed.  Constitutional:      General: He is not in acute distress.    Appearance: He is well-developed.  HENT:     Head: Normocephalic and atraumatic.  Eyes:     Conjunctiva/sclera: Conjunctivae normal.  Cardiovascular:     Rate and Rhythm: Normal rate and regular rhythm.     Heart sounds: Normal heart sounds. No murmur heard. Pulmonary:     Effort: Pulmonary effort is normal. No respiratory distress.     Breath sounds: Normal breath sounds. No wheezing, rhonchi or rales.  Abdominal:     General: Bowel sounds are normal.     Palpations: Abdomen is soft.     Tenderness: There is no abdominal tenderness. There is no guarding or rebound.  Musculoskeletal:        General: No swelling.  Cervical back: Neck supple.  Skin:    General: Skin is warm and dry.     Capillary Refill: Capillary refill takes less than 2 seconds.  Neurological:     Mental Status: He is alert.  Psychiatric:        Mood and Affect: Mood normal.     ED Results / Procedures / Treatments   Labs (all labs ordered are listed, but only abnormal results are displayed) Labs Reviewed  CBC WITH DIFFERENTIAL/PLATELET - Abnormal; Notable for the following components:      Result Value   Monocytes Absolute 1.3 (*)    All other components within normal limits  COMPREHENSIVE METABOLIC PANEL - Abnormal; Notable for the following components:   AST 14 (*)    Total Bilirubin 1.6 (*)    All other components within normal limits   C DIFFICILE QUICK SCREEN W PCR REFLEX    GASTROINTESTINAL PANEL BY PCR, STOOL (REPLACES STOOL CULTURE)  URINE CULTURE  RESP PANEL BY RT-PCR (FLU A&B, COVID) ARPGX2  LIPASE, BLOOD  URINALYSIS, ROUTINE W REFLEX MICROSCOPIC  HEPATITIS B SURFACE ANTIGEN  THIOPURINE METHYLTRANSFERASE (TPMT), RBC  QUANTIFERON-TB GOLD PLUS  GC/CHLAMYDIA PROBE AMP ([place]) NOT AT [facility]    EKG None  Radiology CT ABDOMEN PELVIS W CONTRAST  Result Date: 06/11/2021 CLINICAL DATA:  Inflammatory bowel disease (IBD) concern for entero-vesical fistula EXAM: CT ABDOMEN AND PELVIS WITH CONTRAST TECHNIQUE: Multidetector CT imaging of the abdomen and pelvis was performed using the standard protocol following bolus administration of intravenous contrast. RADIATION DOSE REDUCTION: This exam was performed according to the departmental dose-optimization program which includes automated exposure control, adjustment of the mA and/or kV according to patient size and/or use of iterative reconstruction technique. CONTRAST:  OMNIPAQUE IOHEXOL 300 MG/ML  SOLN COMPARISON:  12/15/2020 FINDINGS: Lower chest: Lung bases are clear.  Heart size is normal. Hepatobiliary: No focal liver abnormality is seen. No gallstones, gallbladder wall thickening, or biliary dilatation. Pancreas: Unremarkable. No pancreatic ductal dilatation or surrounding inflammatory changes. Spleen: Normal in size without focal abnormality. Adrenals/Urinary Tract: Unremarkable adrenal glands. Kidneys enhance symmetrically without focal lesion, stone, or hydronephrosis. Ureters are nondilated. Mild diffuse urinary bladder wall thickening. No air is seen within the bladder lumen. No obvious colovesical fistula. Stomach/Bowel: Long segment bowel wall thickening with mucosal hyperenhancement involving the distal transverse colon, descending colon, sigmoid colon, and rectum. Normal appendix in the right lower quadrant. No dilated loops of bowel to suggest obstruction.  Stomach within normal limits. Vascular/Lymphatic: No significant vascular findings are present. No enlarged abdominal or pelvic lymph nodes. Reproductive: Prostate is unremarkable. Other: No free fluid. No abdominopelvic fluid collection. No pneumoperitoneum. No abdominal wall hernia. Musculoskeletal: No acute or significant osseous findings. IMPRESSION: 1. Long segment bowel wall thickening with mucosal hyperenhancement involving the distal transverse colon, descending colon, sigmoid colon, and rectum. Findings suggest active bowel inflammation in the setting of known inflammatory bowel disease. 2. Mild diffuse urinary bladder wall thickening, which may represent cystitis. No obvious colovesical fistula. Correlate with urinalysis. If further imaging evaluation is warranted, a CT pelvis with rectal contrast could be performed. Electronically Signed   By: Duanne Guess D.O.   On: 06/11/2021 12:03    Procedures Procedures    Medications Ordered in ED Medications  sodium chloride (PF) 0.9 % injection (has no administration in time range)  lactated ringers infusion (has no administration in time range)  methylPREDNISolone sodium succinate (SOLU-MEDROL) 40 mg/mL injection 40 mg (has no administration in time range)  mesalamine (LIALDA) EC tablet 2.4 g (has no administration in time range)  potassium chloride SA (KLOR-CON M) CR tablet 40 mEq (has no administration in time range)  cefTRIAXone (ROCEPHIN) 1 g in sodium chloride 0.9 % 100 mL IVPB (has no administration in time range)  ondansetron (ZOFRAN-ODT) disintegrating tablet 4 mg (4 mg Oral Given 06/11/21 0942)  sodium chloride 0.9 % bolus 1,000 mL (1,000 mLs Intravenous New Bag/Given 06/11/21 1415)  iohexol (OMNIPAQUE) 300 MG/ML solution 100 mL (100 mLs Intravenous Contrast Given 06/11/21 1105)  cefTRIAXone (ROCEPHIN) 1 g in sodium chloride 0.9 % 100 mL IVPB (1 g Intravenous New Bag/Given 06/11/21 1436)    ED Course/ Medical Decision Making/ A&P                            Medical Decision Making Amount and/or Complexity of Data Reviewed Labs: ordered. Radiology: ordered.  Risk Prescription drug management. Decision regarding hospitalization.   This patient presents to the ED for concern of rectal bleeding, this involves an extensive number of treatment options, and is a complaint that carries with it a high risk of complications and morbidity.  The differential diagnosis includes UC flair, hemorrhoids, upper gi bleed, enterovesical fistula   Comorbidities that complicate the patient evaluation: Patients presentation is complicated by their history of ulcerative proctitis  Additional history obtained: Additional history obtained from family Records reviewed previous admission documents and Care Everywhere/External Records  Lab Tests: I Ordered, and personally interpreted labs.  The pertinent results include:   CBC grossly unremarkable CMP with mildly elevated bilirubin, otherwise reassuring Lipase negative UA is pending on admission GI pathogen panel pending Gc/chlam pending Cdiff negative  Imaging Studies ordered: I ordered imaging studies including CT scan abd/pelvis   I independently visualized and interpreted imaging which showed  CT abd/pelvis - 1. Long segment bowel wall thickening with mucosal hyperenhancement involving the distal transverse colon, descending colon, sigmoid colon, and rectum. Findings suggest active bowel inflammation in the setting of known inflammatory bowel disease. 2. Mild diffuse urinary bladder wall thickening, which may represent cystitis.   I agree with the radiologist interpretation  Medicines ordered and prescription drug management: I ordered medication including zofran, ivf, abx  for nausea, dehydration and suspected uti  Reevaluation of the patient after these medicines showed that the patient    improved  Complexity of problems addressed: Patients presentation is most consistent  with  acute presentation with potential threat to life or bodily function  Disposition: After consideration of the diagnostic results and the patients response to treatment,  I feel that the patent would benefit from admission . Marland Kitchen   Consultations Obtained: I requested consultation with the consultant Dr. Therisa Doyne with gastroenterology who evaluated the patient. She recommends steroids, antibiotics, ivf and admission.   I consulted Dr. Tyrell Antonio with hospitalist service who accepts patient for [facility]     Final Clinical Impression(s) / ED Diagnoses Final diagnoses:  Other ulcerative colitis with complication [facility])    Rx / DC Orders ED Discharge Orders     None         Rodney Booze, PA-C 06/11/21 1539    Jadarius Commons S, PA-C 06/11/21 1540    Isla Pence, MD 06/12/21 [phone]

## 2021-06-11 NOTE — Progress Notes (Signed)
[place], 20yo.  Pt has ulcerative colitis and is bleeding  when moving his Bowels, It is ok to hold Lovenox.

## 2021-06-12 DIAGNOSIS — N3090 Cystitis, unspecified without hematuria: Secondary | ICD-10-CM

## 2021-06-12 DIAGNOSIS — K51011 Ulcerative (chronic) pancolitis with rectal bleeding: Secondary | ICD-10-CM | POA: Diagnosis not present

## 2021-06-12 LAB — COMPREHENSIVE METABOLIC PANEL
ALT: 12 U/L (ref 0–44)
AST: 12 U/L — ABNORMAL LOW (ref 15–41)
Albumin: 3.5 g/dL (ref 3.5–5.0)
Alkaline Phosphatase: 61 U/L (ref 38–126)
Anion gap: 8 (ref 5–15)
BUN: 11 mg/dL (ref 6–20)
CO2: 27 mmol/L (ref 22–32)
Calcium: 8.8 mg/dL — ABNORMAL LOW (ref 8.9–10.3)
Chloride: 102 mmol/L (ref 98–111)
Creatinine, Ser: 0.93 mg/dL (ref 0.61–1.24)
GFR, Estimated: >60 mL/min (ref >60)
Glucose, Bld: 127 mg/dL — ABNORMAL HIGH (ref 70–99)
Potassium: 4.2 mmol/L (ref 3.5–5.1)
Sodium: 137 mmol/L (ref 135–145)
Total Bilirubin: 1.2 mg/dL (ref 0.3–1.2)
Total Protein: 6.4 g/dL — ABNORMAL LOW (ref 6.5–8.1)

## 2021-06-12 LAB — GASTROINTESTINAL PANEL BY PCR, STOOL (REPLACES STOOL CULTURE)

## 2021-06-12 LAB — CBC
HCT: 35.6 % — ABNORMAL LOW (ref 39.0–52.0)
Hemoglobin: 12.5 g/dL — ABNORMAL LOW (ref 13.0–17.0)
MCH: 28.8 pg (ref 26.0–34.0)
MCHC: 35.1 g/dL (ref 30.0–36.0)
MCV: 82 fL (ref 80.0–100.0)
Platelets: 235 10*3/uL (ref 150–400)
RBC: 4.34 MIL/uL (ref 4.22–5.81)
RDW: 11.9 % (ref 11.5–15.5)
WBC: 7.5 10*3/uL (ref 4.0–10.5)
nRBC: 0 % (ref 0.0–0.2)

## 2021-06-12 NOTE — Assessment & Plan Note (Addendum)
He reported dysuria, and small amount of purulent drainage from penis at presentation CT with mild diffuse bladder wall thickening UA bland, not c/w UTI GC/chlamydia negative S/p 3 days ceftriaxone

## 2021-06-12 NOTE — Hospital Course (Addendum)
21 yo with hx ulcerative colitis who presented with crohn's flare.  GI following and planning for remicade.  He's improved with steroids and was given remicade on 2/23.  He's improved today.  Planning for discharge with outpatient follow up.  He's had intermittent tachycardia, improved with IVF.    See below for additional details

## 2021-06-12 NOTE — Progress Notes (Signed)
Subjective: Patient complains of ongoing nausea. His mom at bedside complains of sleep disturbance. Patient states his urine is still yellow however burning has decreased. Patient reports that the amount of rectal bleeding has improved, stools are not liquid, described as" cornflakes and nuggets".  Objective: Vital signs in last 24 hours: Temp:  [98.4 F (36.9 C)-98.7 F (37.1 C)] 98.5 F (36.9 C) (02/12 0526) Pulse Rate:  [63-106] 63 (02/12 0526) Resp:  [16-18] 16 (02/12 0130) BP: (99-114)/(59-72) 102/59 (02/12 0526) SpO2:  [99 %-100 %] 99 % (02/12 0526) Weight change:  Last BM Date: 06/12/21  PE: Not in distress GENERAL: No pallor, no icterus  ABDOMEN: Soft, NABS, nontender EXTREMITIES: No deformity  Lab Results: Results for orders placed or performed during the hospital encounter of 06/11/21 (from the past 48 hour(s))  C Difficile Quick Screen w PCR reflex     Status: None   Collection Time: 06/11/21  9:42 AM   Specimen: Stool  Result Value Ref Range   C Diff antigen NEGATIVE NEGATIVE   C Diff toxin NEGATIVE NEGATIVE   C Diff interpretation No C. difficile detected.     Comment: Performed at [facility], [place] [address] [place], [place] [address]  CBC with Differential     Status: Abnormal   Collection Time: 06/11/21  9:46 AM  Result Value Ref Range   WBC 8.3 4.0 - 10.5 K/uL   RBC 5.27 4.22 - 5.81 MIL/uL   Hemoglobin 15.3 13.0 - 17.0 g/dL   HCT 43.5 39.0 - 52.0 %   MCV 82.5 80.0 - 100.0 fL   MCH 29.0 26.0 - 34.0 pg   MCHC 35.2 30.0 - 36.0 g/dL   RDW 12.3 11.5 - 15.5 %   Platelets 289 150 - 400 K/uL   nRBC 0.0 0.0 - 0.2 %   Neutrophils Relative % 71 %   Neutro Abs 5.9 1.7 - 7.7 K/uL   Lymphocytes Relative 11 %   Lymphs Abs 0.9 0.7 - 4.0 K/uL   Monocytes Relative 15 %   Monocytes Absolute 1.3 (H) 0.1 - 1.0 K/uL   Eosinophils Relative 2 %   Eosinophils Absolute 0.1 0.0 - 0.5 K/uL   Basophils Relative 1 %   Basophils Absolute 0.0 0.0 - 0.1  K/uL   Immature Granulocytes 0 %   Abs Immature Granulocytes 0.03 0.00 - 0.07 K/uL    Comment: Performed at [facility], [place] [address] [place], [place] [address]  Comprehensive metabolic panel     Status: Abnormal   Collection Time: 06/11/21  9:46 AM  Result Value Ref Range   Sodium 140 135 - 145 mmol/L   Potassium 3.6 3.5 - 5.1 mmol/L   Chloride 103 98 - 111 mmol/L   CO2 28 22 - 32 mmol/L   Glucose, Bld 98 70 - 99 mg/dL    Comment: Glucose reference range applies only to samples taken after fasting for at least 8 hours.   BUN 15 6 - 20 mg/dL   Creatinine, Ser 1.15 0.61 - 1.24 mg/dL   Calcium 9.3 8.9 - 10.3 mg/dL   Total Protein 7.5 6.5 - 8.1 g/dL   Albumin 4.3 3.5 - 5.0 g/dL   AST 14 (L) 15 - 41 U/L   ALT 12 0 - 44 U/L   Alkaline Phosphatase 72 38 - 126 U/L   Total Bilirubin 1.6 (H) 0.3 - 1.2 mg/dL   GFR, Estimated >60 >60 mL/min    Comment: (NOTE) Calculated using  the CKD-EPI Creatinine Equation (2021)    Anion gap 9 5 - 15    Comment: Performed at [facility], [place] [address] [place], [place] [address]  Lipase, blood     Status: None   Collection Time: 06/11/21  9:46 AM  Result Value Ref Range   Lipase 26 11 - 51 U/L    Comment: Performed at [facility], [place] [address], [place], [place] [address]  Urinalysis, Routine w reflex microscopic Urine, Clean Catch     Status: None   Collection Time: 06/11/21  2:17 PM  Result Value Ref Range   Color, Urine YELLOW YELLOW   APPearance CLEAR CLEAR   Specific Gravity, Urine 1.010 1.005 - 1.030   pH 6.0 5.0 - 8.0   Glucose, UA NEGATIVE NEGATIVE mg/dL   Hgb urine dipstick NEGATIVE NEGATIVE   Bilirubin Urine NEGATIVE NEGATIVE   Ketones, ur NEGATIVE NEGATIVE mg/dL   Protein, ur NEGATIVE NEGATIVE mg/dL   Nitrite NEGATIVE NEGATIVE   Leukocytes,Ua NEGATIVE NEGATIVE    Comment: Performed at [facility], [place] [address], [place], [place] [address]  Hepatitis  B surface antigen     Status: None   Collection Time: 06/11/21  2:45 PM  Result Value Ref Range   Hepatitis B Surface Ag NON REACTIVE NON REACTIVE    Comment: Performed at [place] [address], [place], [place] [address]  Resp Panel by RT-PCR (Flu A&B, Covid)     Status: None   Collection Time: 06/11/21  2:45 PM   Specimen: Nasopharyngeal(NP) swabs in vial transport medium  Result Value Ref Range   SARS Coronavirus 2 by RT PCR NEGATIVE NEGATIVE    Comment: (NOTE) SARS-CoV-2 target nucleic acids are NOT DETECTED.  The SARS-CoV-2 RNA is generally detectable in upper respiratory specimens during the acute phase of infection. The lowest concentration of SARS-CoV-2 viral copies this assay can detect is 138 copies/mL. A negative result does not preclude SARS-Cov-2 infection and should not be used as the sole basis for treatment or other patient management decisions. A negative result may occur with  improper specimen collection/handling, submission of specimen other than nasopharyngeal swab, presence of viral mutation(s) within the areas targeted by this assay, and inadequate number of viral copies(<138 copies/mL). A negative result must be combined with clinical observations, patient history, and epidemiological information. The expected result is Negative.  Fact Sheet for Patients:  EntrepreneurPulse.com.au  Fact Sheet for Healthcare Providers:  IncredibleEmployment.be  This test is no t yet approved or cleared by the [place] FDA and  has been authorized for detection and/or diagnosis of SARS-CoV-2 by FDA under an Emergency Use Authorization (EUA). This EUA will remain  in effect (meaning this test can be used) for the duration of the COVID-19 declaration under Section 564(b)(1) of the Act, 21 U.S.C.section 360bbb-3(b)(1), unless the authorization is terminated  or revoked sooner.       Influenza A by PCR NEGATIVE NEGATIVE    Influenza B by PCR NEGATIVE NEGATIVE    Comment: (NOTE) The Xpert Xpress SARS-CoV-2/FLU/RSV plus assay is intended as an aid in the diagnosis of influenza from Nasopharyngeal swab specimens and should not be used as a sole basis for treatment. Nasal washings and aspirates are unacceptable for Xpert Xpress SARS-CoV-2/FLU/RSV testing.  Fact Sheet for Patients: EntrepreneurPulse.com.au  Fact Sheet for Healthcare Providers: IncredibleEmployment.be  This test is not yet approved or cleared by the [place] and has been authorized for  detection and/or diagnosis of SARS-CoV-2 by FDA under an Emergency Use Authorization (EUA). This EUA will remain in effect (meaning this test can be used) for the duration of the COVID-19 declaration under Section 564(b)(1) of the Act, 21 U.S.C. section 360bbb-3(b)(1), unless the authorization is terminated or revoked.  Performed at [facility], [place] [address] [place], [place] [address]   Comprehensive metabolic panel     Status: Abnormal   Collection Time: 06/12/21  7:16 AM  Result Value Ref Range   Sodium 137 135 - 145 mmol/L   Potassium 4.2 3.5 - 5.1 mmol/L   Chloride 102 98 - 111 mmol/L   CO2 27 22 - 32 mmol/L   Glucose, Bld 127 (H) 70 - 99 mg/dL    Comment: Glucose reference range applies only to samples taken after fasting for at least 8 hours.   BUN 11 6 - 20 mg/dL   Creatinine, Ser 0.93 0.61 - 1.24 mg/dL   Calcium 8.8 (L) 8.9 - 10.3 mg/dL   Total Protein 6.4 (L) 6.5 - 8.1 g/dL   Albumin 3.5 3.5 - 5.0 g/dL   AST 12 (L) 15 - 41 U/L   ALT 12 0 - 44 U/L   Alkaline Phosphatase 61 38 - 126 U/L   Total Bilirubin 1.2 0.3 - 1.2 mg/dL   GFR, Estimated >60 >60 mL/min    Comment: (NOTE) Calculated using the CKD-EPI Creatinine Equation (2021)    Anion gap 8 5 - 15    Comment: Performed at [facility], [place] [address], [place], [place] [address]  CBC     Status:  Abnormal   Collection Time: 06/12/21  7:16 AM  Result Value Ref Range   WBC 7.5 4.0 - 10.5 K/uL   RBC 4.34 4.22 - 5.81 MIL/uL   Hemoglobin 12.5 (L) 13.0 - 17.0 g/dL   HCT 35.6 (L) 39.0 - 52.0 %   MCV 82.0 80.0 - 100.0 fL   MCH 28.8 26.0 - 34.0 pg   MCHC 35.1 30.0 - 36.0 g/dL   RDW 11.9 11.5 - 15.5 %   Platelets 235 150 - 400 K/uL   nRBC 0.0 0.0 - 0.2 %    Comment: Performed at [facility], [place] [address], [place], [place] [address]    Studies/Results: CT ABDOMEN PELVIS W CONTRAST  Result Date: 06/11/2021 CLINICAL DATA:  Inflammatory bowel disease (IBD) concern for entero-vesical fistula EXAM: CT ABDOMEN AND PELVIS WITH CONTRAST TECHNIQUE: Multidetector CT imaging of the abdomen and pelvis was performed using the standard protocol following bolus administration of intravenous contrast. RADIATION DOSE REDUCTION: This exam was performed according to the departmental dose-optimization program which includes automated exposure control, adjustment of the mA and/or kV according to patient size and/or use of iterative reconstruction technique. CONTRAST:  161mL OMNIPAQUE IOHEXOL 300 MG/ML  SOLN COMPARISON:  12/15/2020 FINDINGS: Lower chest: Lung bases are clear.  Heart size is normal. Hepatobiliary: No focal liver abnormality is seen. No gallstones, gallbladder wall thickening, or biliary dilatation. Pancreas: Unremarkable. No pancreatic ductal dilatation or surrounding inflammatory changes. Spleen: Normal in size without focal abnormality. Adrenals/Urinary Tract: Unremarkable adrenal glands. Kidneys enhance symmetrically without focal lesion, stone, or hydronephrosis. Ureters are nondilated. Mild diffuse urinary bladder wall thickening. No air is seen within the bladder lumen. No obvious colovesical fistula. Stomach/Bowel: Long segment bowel wall thickening with mucosal hyperenhancement involving the distal transverse colon, descending colon, sigmoid colon, and rectum. Normal appendix  in the right lower quadrant. No dilated  loops of bowel to suggest obstruction. Stomach within normal limits. Vascular/Lymphatic: No significant vascular findings are present. No enlarged abdominal or pelvic lymph nodes. Reproductive: Prostate is unremarkable. Other: No free fluid. No abdominopelvic fluid collection. No pneumoperitoneum. No abdominal wall hernia. Musculoskeletal: No acute or significant osseous findings. IMPRESSION: 1. Long segment bowel wall thickening with mucosal hyperenhancement involving the distal transverse colon, descending colon, sigmoid colon, and rectum. Findings suggest active bowel inflammation in the setting of known inflammatory bowel disease. 2. Mild diffuse urinary bladder wall thickening, which may represent cystitis. No obvious colovesical fistula. Correlate with urinalysis. If further imaging evaluation is warranted, a CT pelvis with rectal contrast could be performed. Electronically Signed   By: Davina Poke D.O.   On: 06/11/2021 12:03    Medications: I have reviewed the patient's current medications.  Assessment: Ulcerative colitis-admitted with worsening rectal bleeding, diarrhea, abdominal pain  CT shows long segment involvement: Distal transverse, descending, sigmoid, rectum  Burning micturition, cystitis on CAT scan, bacteria on urine analysis  Plan: Changed to regular diet Continue IV Solu-Medrol 40 mg every 12 hours Continue mesalamine 2.4 g twice a day  Continue IV ceftriaxone for cystitis  GI pathogen panel pending, C. difficile negative Possible DC in a.m. depending upon clinical status.  Ronnette Juniper, MD 06/12/2021, 12:01 PM

## 2021-06-12 NOTE — Progress Notes (Signed)
Progress Note   Patient: Gregory Ruiz NLG:[id number] DOB: 03-24-2001 DOA: 06/11/2021     0 DOS: the patient was seen and examined on 06/12/2021   Brief hospital course: 21 year old past medical history significant for asthma as a child, ulcerative colitis diagnosed 08/2018.  Mesalamine presenting with persistent symptoms diarrhea, abdominal pain bloody stools despite treatment with oral prednisone.  Patient admitted to 2/11 with flare of ulcerative colitis and cystitis.  Assessment and Plan: * Ulcerative colitis ([place])- (present on admission) -Patient presenting with worsening abdominal pain, bloody stool, profuse diarrhea. -Patient with acute flare of ulcerative colitis. -CT abdomen and pelvis: Long  segment bowel wall thickening with mucosal hyperenhancement involving the distal transverse colon, descending colon sigmoid colon and rectum.  Findings suggest active bowel inflammation in the setting of known inflammatory bowel disease. -Appreciate Dr Therisa Doyne evaluation.  -Fail oral prednisone.  -Continue with IV solumedrol IV BID.  -Continue with mesalamine BID.  -Checking  hepatitis B surface antigen non reactive, TB gold and TPMT levels in case patient requires escalation of therapy for ulcerative colitis. -Continue with IV fluids.  -C. Diff negative , GI pathogen pending.  He report improvement of diarrhea, and less bloody stool.   Cystitis He reports dysuria, and small amount of purulent drainage from penis/ UA; no WBC.  CT scan: Mild diffuse urinary bladder wall thickening. Follow urine culture, GC chlamydia and gonorrhea. Continue with IV ceftriaxone. Urinary symptoms improved.  Asthma, chronic Patient denies any recent exacerbation.  As needed albuterol. Stable.  Hyperbilirubinemia Might be related to dehydration, Rosanna Randy diseases/  Resolved        Subjective:  He is feeling a little bit better today, reports  less frequency of diarrhea. Stool   with more consistency,  although still watery.  Reports less bloody stool. Dysuria resolved.   Physical Exam: Vitals:   06/11/21 2124 06/12/21 0130 06/12/21 0526 06/12/21 1347  BP: 113/66 99/64 (!) 102/59 113/67  Pulse: 67 67 63 93  Resp: $Remo'18 16  20  'dxMlB$ Temp: 98.5 F (36.9 C) 98.6 F (37 C) 98.5 F (36.9 C) 98.4 F (36.9 C)  TempSrc: Oral Oral Oral Oral  SpO2: 100% 100% 99% 100%   General: No acute distress Cardiovascular: S1-S2 regular normal rate Abdomen: Bowel sounds present, soft nontender no rigidity Extremity: No edema  Data Reviewed:  CBC, hepatitis B surface antigen, c-Met review  Family Communication: Care  discussed with mother who was at bedside  Disposition: Status is: Observation The patient remains OBS appropriate and will d/c before 2 midnights.        Planned Discharge Destination: Home     Time spent: 45 minutes  Author: Elmarie Shiley, MD 06/12/2021 2:03 PM  For on call review [email or web address].

## 2021-06-13 ENCOUNTER — Encounter (HOSPITAL_COMMUNITY): Payer: Self-pay | Admitting: Internal Medicine

## 2021-06-13 DIAGNOSIS — K5190 Ulcerative colitis, unspecified, without complications: Secondary | ICD-10-CM | POA: Diagnosis present

## 2021-06-13 DIAGNOSIS — D500 Iron deficiency anemia secondary to blood loss (chronic): Secondary | ICD-10-CM

## 2021-06-13 DIAGNOSIS — Z880 Allergy status to penicillin: Secondary | ICD-10-CM | POA: Diagnosis not present

## 2021-06-13 DIAGNOSIS — Z79899 Other long term (current) drug therapy: Secondary | ICD-10-CM | POA: Diagnosis not present

## 2021-06-13 DIAGNOSIS — N3090 Cystitis, unspecified without hematuria: Secondary | ICD-10-CM

## 2021-06-13 DIAGNOSIS — K51011 Ulcerative (chronic) pancolitis with rectal bleeding: Secondary | ICD-10-CM | POA: Diagnosis present

## 2021-06-13 DIAGNOSIS — J4520 Mild intermittent asthma, uncomplicated: Secondary | ICD-10-CM | POA: Diagnosis present

## 2021-06-13 DIAGNOSIS — Z20822 Contact with and (suspected) exposure to covid-19: Secondary | ICD-10-CM | POA: Diagnosis present

## 2021-06-13 DIAGNOSIS — R17 Unspecified jaundice: Secondary | ICD-10-CM | POA: Diagnosis present

## 2021-06-13 DIAGNOSIS — E860 Dehydration: Secondary | ICD-10-CM | POA: Diagnosis present

## 2021-06-13 DIAGNOSIS — Z7952 Long term (current) use of systemic steroids: Secondary | ICD-10-CM | POA: Diagnosis not present

## 2021-06-13 LAB — CBC
HCT: 35 % — ABNORMAL LOW (ref 39.0–52.0)
Hemoglobin: 12.4 g/dL — ABNORMAL LOW (ref 13.0–17.0)
MCH: 29.6 pg (ref 26.0–34.0)
MCHC: 35.4 g/dL (ref 30.0–36.0)
MCV: 83.5 fL (ref 80.0–100.0)
Platelets: 245 10*3/uL (ref 150–400)
RBC: 4.19 MIL/uL — ABNORMAL LOW (ref 4.22–5.81)
RDW: 12.2 % (ref 11.5–15.5)
WBC: 7 10*3/uL (ref 4.0–10.5)
nRBC: 0 % (ref 0.0–0.2)

## 2021-06-13 LAB — BASIC METABOLIC PANEL
Anion gap: 5 (ref 5–15)
BUN: 13 mg/dL (ref 6–20)
CO2: 27 mmol/L (ref 22–32)
Calcium: 8.7 mg/dL — ABNORMAL LOW (ref 8.9–10.3)
Chloride: 104 mmol/L (ref 98–111)
Creatinine, Ser: 0.97 mg/dL (ref 0.61–1.24)
GFR, Estimated: >60 mL/min (ref >60)
Glucose, Bld: 127 mg/dL — ABNORMAL HIGH (ref 70–99)
Potassium: 3.9 mmol/L (ref 3.5–5.1)
Sodium: 136 mmol/L (ref 135–145)

## 2021-06-13 LAB — GC/CHLAMYDIA PROBE AMP (~~LOC~~) NOT AT ARMC
Chlamydia: NEGATIVE
Comment: NEGATIVE
Comment: NORMAL
Neisseria Gonorrhea: NEGATIVE

## 2021-06-13 LAB — FERRITIN: Ferritin: 45 ng/mL (ref 24–336)

## 2021-06-13 LAB — VITAMIN B12: Vitamin B-12: 410 pg/mL (ref 180–914)

## 2021-06-13 LAB — IRON AND TIBC
Iron: 27 ug/dL — ABNORMAL LOW (ref 45–182)
Saturation Ratios: 12 % — ABNORMAL LOW (ref 17.9–39.5)
TIBC: 230 ug/dL — ABNORMAL LOW (ref 250–450)
UIBC: 203 ug/dL

## 2021-06-13 MED ORDER — PANTOPRAZOLE SODIUM 40 MG PO TBEC
40.0000 mg | DELAYED_RELEASE_TABLET | Freq: Two times a day (BID) | ORAL | Status: DC
  Administered 2021-06-13 – 2021-06-24 (×21): 40 mg via ORAL
  Filled 2021-06-13 (×22): qty 1

## 2021-06-13 MED ORDER — VITAMIN B-12 100 MCG PO TABS
100.0000 ug | ORAL_TABLET | Freq: Every day | ORAL | Status: DC
  Administered 2021-06-13 – 2021-06-24 (×11): 100 ug via ORAL
  Filled 2021-06-13 (×12): qty 1

## 2021-06-13 MED ORDER — FERROUS SULFATE 325 (65 FE) MG PO TABS
325.0000 mg | ORAL_TABLET | Freq: Every day | ORAL | Status: DC
  Administered 2021-06-13 – 2021-06-15 (×3): 325 mg via ORAL
  Filled 2021-06-13 (×4): qty 1

## 2021-06-13 NOTE — Progress Notes (Signed)
[facility] Gastroenterology Progress Note  Gregory Ruiz 21 y.o. May 10, 2000  CC: Ulcerative colitis   Subjective: Patient states he had a rough night last night.  Had multiple bowel movements (3-4) that were watery and a mixture of blood and yellow.  States it is more blood than it had been the past couple days.  Blood was both in the toilet and on the tissue paper.  Reports nausea that is well controlled with Zofran.  Patient has been tolerating diet well and ate [place] fries and frosted flakes last night without difficulty  ROS : Review of Systems  Eyes:  Negative for blurred vision and double vision.  Gastrointestinal:  Positive for blood in stool, diarrhea and nausea. Negative for abdominal pain, constipation, heartburn, melena and vomiting.     Objective: Vital signs in last 24 hours: Vitals:   06/12/21 2036 06/13/21 0620  BP: 115/63 112/60  Pulse: 93 (!) 59  Resp: 16 18  Temp: 98.8 F (37.1 C) 98.1 F (36.7 C)  SpO2: 100% 99%    Physical Exam:  General:  Alert, cooperative, no distress, father at bedside  Head:  Normocephalic, without obvious abnormality, atraumatic  Eyes:  Anicteric sclera, EOM's intact  Lungs:   Clear to auscultation bilaterally, respirations unlabored  Heart:  Regular rate and rhythm, S1, S2 normal  Abdomen:   Soft, non-tender, bowel sounds active all four quadrants,  no masses,     Lab Results: Recent Labs    06/11/21 0946 06/12/21 0716  NA 140 137  K 3.6 4.2  CL 103 102  CO2 28 27  GLUCOSE 98 127*  BUN 15 11  CREATININE 1.15 0.93  CALCIUM 9.3 8.8*   Recent Labs    06/11/21 0946 06/12/21 0716  AST 14* 12*  ALT 12 12  ALKPHOS 72 61  BILITOT 1.6* 1.2  PROT 7.5 6.4*  ALBUMIN 4.3 3.5   Recent Labs    06/11/21 0946 06/12/21 0716  WBC 8.3 7.5  NEUTROABS 5.9  --   HGB 15.3 12.5*  HCT 43.5 35.6*  MCV 82.5 82.0  PLT 289 235   No results for input(s): LABPROT, INR in the last 72 hours.    Assessment Ulcerative colitis -CT  abdomen pelvis with contrast: Long segment involvement: Distal transverse, descending, sigmoid, rectum -GI pathogen panel and C. difficile negative -No leukocytosis -Hgb 12.5 -Normal renal function  Cystitis -Currently on ceftriaxone IV  Plan: Continue IV Solu-Medrol 40 mg every 12 hours Continue mesalamine 2.4G twice a day GI pathogen panel and C. difficile was negative Patient having worsening bleeding and diarrhea compared to yesterday.  May need to remain admitted for 1 more night for further evaluation and reassess plan for discharge tomorrow a.m. [facility] will follow  Garnette Scheuermann PA-C 06/13/2021, 8:45 AM  Contact #  [phone]

## 2021-06-13 NOTE — Assessment & Plan Note (Addendum)
He probably has chronic blood loss from colitis.  Iron level low at 27, ferritin 45.  Started  Iron supplement ordered at discharge, but will instruct him to start this only when UC symptoms resolved B12 is wnl Follow folate level wnl

## 2021-06-13 NOTE — Progress Notes (Signed)
°  Progress Note   Patient: Gregory Ruiz [id number] DOB: 02/03/2001 DOA: 06/11/2021     0 DOS: the patient was seen and examined on 06/13/2021   Brief hospital course: 21 year old past medical history significant for asthma as a child, ulcerative colitis diagnosed 08/2018.  Mesalamine presenting with persistent symptoms diarrhea, abdominal pain bloody stools despite treatment with oral prednisone.  Patient admitted to 2/11 with flare of ulcerative colitis and cystitis.  Assessment and Plan: * Ulcerative colitis ([place])- (present on admission) -Patient presenting with worsening abdominal pain, bloody stool, profuse diarrhea. -Patient with acute flare of ulcerative colitis. -CT abdomen and pelvis: Long  segment bowel wall thickening with mucosal hyperenhancement involving the distal transverse colon, descending colon sigmoid colon and rectum.  Findings suggest active bowel inflammation in the setting of known inflammatory bowel disease. -Appreciate Dr Therisa Doyne evaluation.  -Fail oral prednisone.  -Continue with IV solumedrol IV BID.  -Continue with mesalamine BID.  -Checking  hepatitis B surface antigen non reactive, TB gold and TPMT levels in case patient requires escalation of therapy for ulcerative colitis. -Continue with IV fluids.  -C. Diff negative , GI pathogen negative.  -he had difficult night, had 3 watery , with more Blood stool. He has been nausea as well.  Will add GI PPI for prophylaxis while on steroids.  Hold lovenox. Repeat CBC>   Iron deficiency anemia due to chronic blood loss Patient hb relatively stable at 12.  He probably has chronic blood loss from colitis.  Iron level low at 27, ferritin 45.  Will start Iron supplement.  Will also start B 12 supplement.   Cystitis He reports dysuria, and small amount of purulent drainage from penis/ UA; no WBC.  CT scan: Mild diffuse urinary bladder wall thickening. Follow urine culture, GC chlamydia and gonorrhea. Pending.   Continue with IV ceftriaxone. Day 2/3.  Urinary symptoms improved.  Asthma, chronic Patient denies any recent exacerbation.  As needed albuterol. Stable.  Hyperbilirubinemia Might be related to dehydration, Rosanna Randy diseases/  Resolved        Subjective:  He had ruff night, had 3 watery mix with flakes BM. Stool with more blood.  He was having nausea. Nausea improved this am with nausea meds.  He still report mild discomfort when he start urinating, but it them resolved.    Physical Exam: Vitals:   06/12/21 1347 06/12/21 2036 06/13/21 0620 06/13/21 0700  BP: 113/67 115/63 112/60   Pulse: 93 93 (!) 59   Resp: 20 16 18    Temp: 98.4 F (36.9 C) 98.8 F (37.1 C) 98.1 F (36.7 C)   TempSrc: Oral Oral Oral   SpO2: 100% 100% 99%   Weight:    59 kg  Height:    5\' 7"  (1.702 m)   General; NAD Lungs; CTA Abdomen; soft nt  Data Reviewed:  Cbc and bmet reviewed. Iron level reviewed.   Family Communication: Care discussed with father at bedside.   Disposition: Status is: Observation The patient will require care spanning > 2 midnights and should be moved to inpatient because: requiring IV fluids and IV steroids.         Planned Discharge Destination: Home     Time spent: 45 minutes  Author: Elmarie Shiley, MD 06/13/2021 2:29 PM  For on call review [email or web address].

## 2021-06-13 NOTE — Progress Notes (Signed)
°  Transition of Care [facility]) Screening Note   Patient Details  Name: Gregory Ruiz Date of Birth: November 26, 2000   Transition of Care [facility]) CM/SW Contact:    Amada Jupiter, LCSW Phone Number: 06/13/2021, 10:17 AM    Transition of Care Department [facility]) has reviewed patient and no TOC needs have been identified at this time. We will continue to monitor patient advancement through interdisciplinary progression rounds. If new patient transition needs arise, please place a TOC consult.

## 2021-06-14 LAB — BASIC METABOLIC PANEL
Anion gap: 7 (ref 5–15)
BUN: 15 mg/dL (ref 6–20)
CO2: 26 mmol/L (ref 22–32)
Calcium: 8.6 mg/dL — ABNORMAL LOW (ref 8.9–10.3)
Chloride: 101 mmol/L (ref 98–111)
Creatinine, Ser: 0.93 mg/dL (ref 0.61–1.24)
GFR, Estimated: >60 mL/min (ref >60)
Glucose, Bld: 123 mg/dL — ABNORMAL HIGH (ref 70–99)
Potassium: 3.8 mmol/L (ref 3.5–5.1)
Sodium: 134 mmol/L — ABNORMAL LOW (ref 135–145)

## 2021-06-14 LAB — CBC
HCT: 35 % — ABNORMAL LOW (ref 39.0–52.0)
HCT: 36.3 % — ABNORMAL LOW (ref 39.0–52.0)
Hemoglobin: 12.2 g/dL — ABNORMAL LOW (ref 13.0–17.0)
Hemoglobin: 12.5 g/dL — ABNORMAL LOW (ref 13.0–17.0)
MCH: 29.4 pg (ref 26.0–34.0)
MCH: 28.8 pg (ref 26.0–34.0)
MCHC: 34.9 g/dL (ref 30.0–36.0)
MCHC: 34.4 g/dL (ref 30.0–36.0)
MCV: 84.3 fL (ref 80.0–100.0)
MCV: 83.6 fL (ref 80.0–100.0)
Platelets: 245 10*3/uL (ref 150–400)
Platelets: 266 10*3/uL (ref 150–400)
RBC: 4.15 MIL/uL — ABNORMAL LOW (ref 4.22–5.81)
RBC: 4.34 MIL/uL (ref 4.22–5.81)
RDW: 12.1 % (ref 11.5–15.5)
RDW: 12 % (ref 11.5–15.5)
WBC: 7.9 10*3/uL (ref 4.0–10.5)
WBC: 7 10*3/uL (ref 4.0–10.5)
nRBC: 0 % (ref 0.0–0.2)
nRBC: 0 % (ref 0.0–0.2)

## 2021-06-14 LAB — QUANTIFERON-TB GOLD PLUS

## 2021-06-14 LAB — MAGNESIUM: Magnesium: 2.2 mg/dL (ref 1.7–2.4)

## 2021-06-14 LAB — HIV ANTIBODY (ROUTINE TESTING W REFLEX): HIV Screen 4th Generation wRfx: NONREACTIVE

## 2021-06-14 MED ORDER — DICYCLOMINE HCL 20 MG PO TABS
20.0000 mg | ORAL_TABLET | Freq: Two times a day (BID) | ORAL | Status: DC
  Administered 2021-06-14 – 2021-06-15 (×3): 20 mg via ORAL
  Filled 2021-06-14 (×3): qty 1

## 2021-06-14 MED ORDER — DICYCLOMINE HCL 20 MG PO TABS
20.0000 mg | ORAL_TABLET | ORAL | Status: DC | PRN
  Filled 2021-06-14: qty 1

## 2021-06-14 MED ORDER — LOPERAMIDE HCL 2 MG PO CAPS
4.0000 mg | ORAL_CAPSULE | ORAL | Status: DC | PRN
  Administered 2021-06-14: 4 mg via ORAL
  Filled 2021-06-14: qty 2

## 2021-06-14 MED ORDER — METHYLPREDNISOLONE SODIUM SUCC 125 MG IJ SOLR
60.0000 mg | Freq: Two times a day (BID) | INTRAMUSCULAR | Status: AC
  Administered 2021-06-14 – 2021-06-21 (×15): 60 mg via INTRAVENOUS
  Filled 2021-06-14 (×15): qty 2

## 2021-06-14 MED ORDER — ADULT MULTIVITAMIN W/MINERALS CH
1.0000 | ORAL_TABLET | Freq: Every day | ORAL | Status: DC
  Administered 2021-06-14 – 2021-06-24 (×10): 1 via ORAL
  Filled 2021-06-14 (×11): qty 1

## 2021-06-14 MED ORDER — BOOST / RESOURCE BREEZE PO LIQD CUSTOM
1.0000 | Freq: Two times a day (BID) | ORAL | Status: DC
  Administered 2021-06-14 – 2021-06-24 (×7): 1 via ORAL

## 2021-06-14 NOTE — Progress Notes (Signed)
Progress Note   Patient: Gregory Ruiz KDX:[id number] DOB: Oct 22, 2000 DOA: 06/11/2021     1 DOS: the patient was seen and examined on 06/14/2021   Brief hospital course: 21 year old past medical history significant for asthma as a child, ulcerative colitis diagnosed 08/2018.  Mesalamine presenting with persistent symptoms diarrhea, abdominal pain bloody stools despite treatment with oral prednisone.  Patient admitted to 2/11 with flare of ulcerative colitis and cystitis.  Symptoms persist today, plan to increase IV steroids, add loperamide. GI following.   Assessment and Plan: * Ulcerative colitis ([place])- (present on admission) -Patient presenting with worsening abdominal pain, bloody stool, profuse diarrhea. -Patient with acute flare of ulcerative colitis. -CT abdomen and pelvis: Long  segment bowel wall thickening with mucosal hyperenhancement involving the distal transverse colon, descending colon sigmoid colon and rectum.  Findings suggest active bowel inflammation in the setting of known inflammatory bowel disease. -Fail oral prednisone.  -Continue with mesalamine BID.  -Checking hepatitis B surface antigen non reactive, TB gold and TPMT levels in case patient requires escalation of therapy for ulcerative colitis. -Continue with IV fluids.  -C. Diff negative , GI pathogen negative.  -On  GI PPI for prophylaxis while on steroids.  He had worsening symptoms last night, having abdominal pain and nausea this am.  Discussed with Dr [name], plan to increase IV steroids to 60 mg IV BID> added imodium.   Iron deficiency anemia due to chronic blood loss Patient hb relatively stable at 12.  He probably has chronic blood loss from colitis.  Iron level low at 27, ferritin 45.  Started  Iron supplement.  Started  B 12 supplement.   Cystitis He reports dysuria, and small amount of purulent drainage from penis/ UA; no WBC.  CT scan: Mild diffuse urinary bladder wall thickening. Follow  urine culture, GC chlamydia and gonorrhea; Negative.  He has completed 3 days of IV antibiotics. Discontinue antibiotics.  Urinary symptoms improved.  Asthma, chronic No  recent exacerbation.  As needed albuterol. Stable.  Hyperbilirubinemia Might be related to dehydration, Rosanna Randy diseases/  Resolved        Subjective:  He had difficult night again, multiples bloody BM associated with abdominal cramps pain and nausea.   During my assessment he had to go to bathroom because of diarrhea and abdominal pain. He had nausea as well. I informed the nurse, he will get Zofran.   Physical Exam: Vitals:   06/13/21 0700 06/13/21 1458 06/13/21 1534 06/13/21 2215  BP:  115/67 110/64 125/72  Pulse:  67 68 72  Resp:  16 16   Temp:  98.5 F (36.9 C) 98.2 F (36.8 C) 98.7 F (37.1 C)  TempSrc:  Oral Oral Oral  SpO2:  100% 100% 99%  Weight: 59 kg     Height: $Remove'5\' 7"'[id number]$  (1.702 m)      General; No acute distress;  CVS; S 1, S 2 RRR Lungs; CTA Abdomen; soft, mild tenderness , no rigidity   Data Reviewed:  Iron, B 12 results,cbc and B-met/.    Family Communication: Mother and father at bedside.   Disposition: Status is: Inpatient Remains inpatient appropriate because: uncontrolled symptoms.       Planned Discharge Destination: Home     Time spent: 45 minutes  Author: Elmarie Shiley, MD 06/14/2021 1:00 PM  For on call review [email or web address].

## 2021-06-14 NOTE — Progress Notes (Signed)
[facility] Gastroenterology Progress Note  Gregory Ruiz 21 y.o. 12-Jun-2000  CC:  Ulcerative colitis   Subjective: Patient resting comfortably in bed this morning, states last night was worse than the previous night.  He states he had to get up every hour to have a bowel movement and this was associated with generalized abdominal pain and nausea.  He reports seeing blood in the toilet with the first 2 bowel movements last night but amount of blood seen throughout the night decreased but the amount of blood decreased with each bowel movement. Denies melena.  Patient was able to tolerate regular diet yesterday and denies vomiting. No fever or chills. He states his dysuria has improved significantly.  ROS : Review of Systems  Constitutional:  Negative for chills and fever.  Gastrointestinal:  Positive for abdominal pain, blood in stool, diarrhea and nausea. Negative for constipation, heartburn, melena and vomiting.  Genitourinary:  Positive for dysuria (still present but much improved). Negative for urgency.     Objective: Vital signs in last 24 hours: Vitals:   06/13/21 1534 06/13/21 2215  BP: 110/64 125/72  Pulse: 68 72  Resp: 16   Temp: 98.2 F (36.8 C) 98.7 F (37.1 C)  SpO2: 100% 99%    Physical Exam:  General:  Alert, cooperative, no distress, appears stated age  Head:  Normocephalic, without obvious abnormality, atraumatic  Eyes:  Anicteric sclera, EOM's intact  Lungs:   Clear to auscultation bilaterally, respirations unlabored  Heart:  Regular rate and rhythm, S1, S2 normal  Abdomen:   Soft, non-tender, bowel sounds active all four quadrants,  no masses,   Extremities: Extremities normal, atraumatic, no  edema  Pulses: 2+ and symmetric    Lab Results: Recent Labs    06/13/21 0822 06/14/21 1007  NA 136 134*  K 3.9 3.8  CL 104 101  CO2 27 26  GLUCOSE 127* 123*  BUN 13 15  CREATININE 0.97 0.93  CALCIUM 8.7* 8.6*   Recent Labs    06/12/21 0716  AST 12*  ALT 12   ALKPHOS 61  BILITOT 1.2  PROT 6.4*  ALBUMIN 3.5   Recent Labs    06/13/21 0822 06/14/21 0437  WBC 7.0 7.9  HGB 12.4* 12.2*  HCT 35.0* 35.0*  MCV 83.5 84.3  PLT 245 245   No results for input(s): LABPROT, INR in the last 72 hours.    Assessment Ulcerative colitis - CT abdomen pelvis with contrast: Long segment involvement: Distal transverse, descending, sigmoid, rectum - GI pathogen panel and C. difficile negative - No leukocytosis - Hgb 12.2 - Normal renal function   Cystitis - Currently on ceftriaxone IV - Dysuria improved - Negative for gonorrhea/chlamydia   Plan: Increase IV Solu-Medrol to 60mg  every 12 hours. Continue mesalamine 2.4 g twice a day Loose, bloody stools last night, every hour per patient and family. Doing better this morning but will need to remain admitted for another night. Can reevaluate discharge tomorrow morning. Add imodium 4mg  as needed for diarrhea. Add Bentyl 20mg  as needed for abdominal pain. Continue diet as tolerated. Quant-TB Gold pending. [facility] will follow.  Angelique Holm PA-C 06/14/2021, 11:05 AM  Contact #  [phone]

## 2021-06-14 NOTE — Discharge Instructions (Addendum)
Ulcerative Colitis Nutrition Therapy  If you have IBD, you might not be able to digest all the food you eat, so you may need more vitamins and minerals. Your medications might affect your ability to eat or how your body absorbs nutrients.  How much fiber you should eat depends on your symptoms and the amount of inflammation in your intestines. If you are taking prednisone or budesonide medications, then you should limit the amount of fiber that you are eating. If you are experiencing symptoms like diarrhea, abdominal pain, low-fiber foods are the easiest to digest and are less irritating for your intestines. If you dont have symptoms, then check with your health care provider or registered dietitian nutritionist (RDN) to see if you may add more fiber to your diet. Its also important to eat enough protein foods while you are on the IBD diet.  Tips  Eat small meals or snacks every 3 or 4 hours. Do not skip meals. When you have symptoms, or if you are taking prednisone or budesonide, eat the foods in the Recommended Foods chart. These foods are lower in fiber. Eat a protein food or dairy product at every meal or snack if your body can tolerate it. See the Foods Recommended table for ideas. Drink a lot of fluids, at least 8 cups each day. Limit caffeinated, sugary drinks and beverages made with sugar substitutes. Eat foods that have probiotics (yogurt, kefir) and prebiotics (bananas). Ask your RDN for other suggestions. You may need to take supplements as part of your IBD treatment. Take a chewable multivitamin with minerals. Ask your RDN for recommendations. If you are taking methotrexate or sulfasalazine, take one multivitamin with minerals and a supplement with 1 milligram of folic acid daily. Take a chewable calcium supplement with vitamin D if you are not getting enough calcium from your diet. Check with your health care provider before starting probiotic or prebiotic supplements. When you dont  have symptoms (no blood in your stools), you are no longer taking prednisone or budesonide, and your inflammation is mild, your health care provider may recommend that you begin including whole grains and a variety of fruits and vegetables in your diet. Only add one to two new foods to your diet each week in small amounts and monitor your symptoms. Stop eating the new food if you develop abdominal pain or diarrhea. You can try it again after a few weeks.  From Academy of [employer] Dietetics

## 2021-06-14 NOTE — Progress Notes (Addendum)
Initial Nutrition Assessment  INTERVENTION:   -Boost Breeze po BID, each supplement provides 250 kcal and 9 grams of protein  -Multivitamin with minerals daily  -Provided "IBD Nutrition Therapy" handout in discharge instructions  NUTRITION DIAGNOSIS:   Increased nutrient needs related to chronic illness as evidenced by estimated needs.  GOAL:   Patient will meet greater than or equal to 90% of their needs  MONITOR:   PO intake, Supplement acceptance, Labs, Weight trends, I & O's  REASON FOR ASSESSMENT:   Malnutrition Screening Tool    ASSESSMENT:   21 year old past medical history significant for asthma as a child, ulcerative colitis diagnosed 08/2018.  Mesalamine presenting with persistent symptoms diarrhea, abdominal pain bloody stools despite treatment with oral prednisone.     Patient admitted to 2/11 with flare of ulcerative colitis and cystitis.  Patient in room with father at bedside.  Pt looking at menu trying to decide what to order for breakfast. States he is feeling nauseous this morning, having loose stools but not sure what to eat.  Pt doesn't eat as well when he is having a UC flare. Typically eats 2 meals a day with a snack in between. States he tries to eat more protein with his lunch and dinner. Does not like to eat breakfast, says this upsets his stomach. Tolerates some dairy but he doesn't consume much as he doesn't like it.   We reviewed soluble and insoluble fiber. Discussed better options on menu to try and eat. States he did not tolerate a ham sandwich yesterday. Will provide IBD diet information in discharge instructions.   Pt likes the [employer], will continue these for additional kcals and protein.  Per weight records, no weight changes noted. UBW is 130 lbs. States he has noted a 3 lb weight loss.  Medications: Ferrous sulfate, Vitamin B-12, Lactated ringers, Imodium, Zofran  Labs reviewed:  Low Na Low iron  NUTRITION - FOCUSED  PHYSICAL EXAM:  No depletions noted.  Diet Order:   Diet Order             Diet regular Room service appropriate? Yes; Fluid consistency: Thin  Diet effective now                   EDUCATION NEEDS:   Education needs have been addressed  Skin:  Skin Assessment: Reviewed RN Assessment  Last BM:  2/13 -type 6  Height:   Ht Readings from Last 1 Encounters:  06/13/21 5\' 7"  (1.702 m) (18 %, Z= -0.93)*   * Growth percentiles are based on CDC (Boys, 2-20 Years) data.    Weight:   Wt Readings from Last 1 Encounters:  06/13/21 59 kg (11 %, Z= -1.20)*   * Growth percentiles are based on CDC (Boys, 2-20 Years) data.   BMI:  Body mass index is 20.37 kg/m.  Estimated Nutritional Needs:   Kcal:  1500-1700  Protein:  85-100g  Fluid:  1.7L/day   Gregory Bibles, MS, RD, LDN Inpatient Clinical Dietitian Contact information available via Amion

## 2021-06-15 MED ORDER — HYOSCYAMINE SULFATE 0.125 MG PO TBDP
0.1250 mg | ORAL_TABLET | Freq: Four times a day (QID) | ORAL | Status: DC | PRN
  Administered 2021-06-15: 0.125 mg via SUBLINGUAL
  Filled 2021-06-15 (×3): qty 1

## 2021-06-15 MED ORDER — MORPHINE SULFATE (PF) 2 MG/ML IV SOLN
1.0000 mg | INTRAVENOUS | Status: DC | PRN
  Administered 2021-06-15: 1 mg via INTRAVENOUS
  Filled 2021-06-15 (×2): qty 1

## 2021-06-15 MED ORDER — LOPERAMIDE HCL 2 MG PO CAPS
2.0000 mg | ORAL_CAPSULE | ORAL | Status: DC | PRN
  Administered 2021-06-15: 2 mg via ORAL
  Filled 2021-06-15: qty 1

## 2021-06-15 MED ORDER — PROCHLORPERAZINE EDISYLATE 10 MG/2ML IJ SOLN
5.0000 mg | Freq: Once | INTRAMUSCULAR | Status: AC
  Administered 2021-06-15: 5 mg via INTRAVENOUS
  Filled 2021-06-15: qty 2

## 2021-06-15 MED ORDER — DICYCLOMINE HCL 20 MG PO TABS
20.0000 mg | ORAL_TABLET | Freq: Three times a day (TID) | ORAL | Status: DC
  Filled 2021-06-15: qty 1

## 2021-06-15 NOTE — Progress Notes (Signed)
[facility] Gastroenterology Progress Note  Gregory Ruiz 21 y.o. [birth year]  CC: Ulcerative colitis   Subjective: Patient states he had a rough night last night.  Often waking up every 2 hours and going to the bathroom.  Reports abdominal cramps.  Tolerating diet, ate a [place] sandwich last night.  Still having nausea.  Had 1 episode of vomiting last night as well.  ROS : Review of Systems  Gastrointestinal:  Positive for abdominal pain, blood in stool, diarrhea, nausea and vomiting. Negative for constipation, heartburn and melena.  Genitourinary:  Negative for dysuria and urgency.     Objective: Vital signs in last 24 hours: Vitals:   06/14/21 2211 06/15/21 0522  BP: 128/77 131/78  Pulse: 64 68  Resp: 16 15  Temp: 99.5 F (37.5 C) 98.8 F (37.1 C)  SpO2: 99% 98%    Physical Exam:  General:  Alert, cooperative, no distress, thin, father at bedside  Head:  Normocephalic, without obvious abnormality, atraumatic  Eyes:  Anicteric sclera, EOM's intact  Lungs:   Clear to auscultation bilaterally, respirations unlabored  Heart:  Regular rate and rhythm, S1, S2 normal  Abdomen:   Soft, non-tender, bowel sounds active all four quadrants,  no masses,     Lab Results: Recent Labs    06/13/21 0822 06/14/21 1007 06/14/21 1145  NA 136 134*  --   K 3.9 3.8  --   CL 104 101  --   CO2 27 26  --   GLUCOSE 127* 123*  --   BUN 13 15  --   CREATININE 0.97 0.93  --   CALCIUM 8.7* 8.6*  --   MG  --   --  2.2   No results for input(s): AST, ALT, ALKPHOS, BILITOT, PROT, ALBUMIN in the last 72 hours. Recent Labs    06/14/21 0437 06/14/21 1145  WBC 7.9 7.0  HGB 12.2* 12.5*  HCT 35.0* 36.3*  MCV 84.3 83.6  PLT 245 266   No results for input(s): LABPROT, INR in the last 72 hours.    Assessment Ulcerative colitis - CT abdomen pelvis with contrast: Long segment involvement: Distal transverse, descending, sigmoid, rectum - GI pathogen panel and C. difficile negative - No  leukocytosis - Hgb 12.2 - Normal renal function -QuantiFERON-TB gold pending   Cystitis - Currently on ceftriaxone IV - Dysuria improved - Negative for gonorrhea/chlamydia   Plan: Continue IV Solu-Medrol 60 mg every 12 hours Continue mesalamine 2.4G twice a day Can use Bentyl 20 mg every 4-6 hours as needed for abdominal pain Continue Imodium 4 mg up to 12 tablets a day as needed for diarrhea Continue diet as tolerated QuantiFERON-TB gold pending [facility] will follow  Garnette Scheuermann PA-C 06/15/2021, 11:25 AM  Contact #  [phone]

## 2021-06-15 NOTE — Progress Notes (Signed)
Pt had 2 episodes of vomiting since morning, MD is notified and recommended to keep him NPO. Abdominal cramps is not resolving, after taking PO Bentyl Pt gets relief and then after couple hours Pt is getting cramps again. MD is notified. Bentyl dose also changed. Monitoring closely.

## 2021-06-15 NOTE — Progress Notes (Signed)
PROGRESS NOTE   Gregory Ruiz  [id number] DOB: 2000/05/23 DOA: 06/11/2021 PCP: [name], MD  Brief Narrative:  21 year old white male Known ulcerative colitis with rectal bleeding followed by Dr. [name] [facility]--Focal Active colitis on [facility] 08/2020 + proctitis/ileitis--maintained on mesalamine 1.2g 2 tb /qd Acute intermittent asthma Seen in ED 06/07/2021 and started on prednisone-no improvement-decreased p.o. nausea vomiting?  Purulent penile discharge  Admit to/11 2/11 with nausea vomiting, inability to take oral steroids-CT scan ABD = long segment bowel thickening and mucosal hyperenhancement distal transverse descending sigmoid rectum without colovesical fistula on imaging  GI saw the patient-started on IV Solu-Medrol, started on higher dose of mesalamine 2.4 g twice daily Hepatitis panels were negative Treated for cystitis X 3 days Rocephin-resolved Hyperbilirubinemia??  Gilbert's disease   Hospital-Problem based course  Ulcerative colitis flare Diagnosed 2021?  Follows closely in the outpatient setting with Dr. [name] who have discussed plan of care with today-- Patient will continue higher dose Solu-Medrol 60 twice daily,'s loperamide has been changed to 2 mg as needed as he seems to have symptoms when he sits on the toilet and has spasm and feels like he is vomiting/nauseous Mesalamine currently at 2.4 g twice daily Hyoscyamine started in lieu of dicyclomine Cut back saline to 50 cc/H Further planning including?  Colonoscopy as per GI--follow lactoferrin ?  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 Iron level 27 ferritin 45-supplementation started continue B12 Stable asthma No need inhalers today  DVT prophylaxis: scd Code Status: full Family Communication: Long and detailed discussion with patient's Father Gerald Stabs at the bedside phone number [phone] Disposition:  Status is: Inpatient Remains inpatient  appropriate because:   Still not stable   Consultants:  Gastroenterology  Procedures:   Antimicrobials:     Subjective: Awake alert no distress looks comfortable Told that patient was vomiting but  seems comfortable  Objective: Vitals:   06/14/21 1409 06/14/21 2211 06/15/21 0522 06/15/21 1218  BP: 112/72 128/77 131/78 106/63  Pulse: 69 64 68 (!) 55  Resp: 16 16 15 16   Temp: 98.2 F (36.8 C) 99.5 F (37.5 C) 98.8 F (37.1 C) 98 F (36.7 C)  TempSrc: Oral Oral Oral Oral  SpO2: 99% 99% 98% 100%  Weight:      Height:        Intake/Output Summary (Last 24 hours) at 06/15/2021 1733 Last data filed at 06/15/2021 1400 Gross per 24 hour  Intake 1197.88 ml  Output --  Net 1197.88 ml   Filed Weights   06/13/21 0700  Weight: 59 kg    Examination:  Eomi ncat no iuct no pallor Cta b no added sound  Abd soft, slight distended with some discomfort Neuro intact No LE edema  Data Reviewed: personally reviewed   CBC    Component Value Date/Time   WBC 7.0 06/14/2021 1145   RBC 4.34 06/14/2021 1145   HGB 12.5 (L) 06/14/2021 1145   HCT 36.3 (L) 06/14/2021 1145   PLT 266 06/14/2021 1145   MCV 83.6 06/14/2021 1145   MCH 28.8 06/14/2021 1145   MCHC 34.4 06/14/2021 1145   RDW 12.0 06/14/2021 1145   LYMPHSABS 0.9 06/11/2021 0946   MONOABS 1.3 (H) 06/11/2021 0946   EOSABS 0.1 06/11/2021 0946   BASOSABS 0.0 06/11/2021 0946   CMP Latest Ref Rng & Units 06/14/2021 06/13/2021 06/12/2021  Glucose 70 - 99 mg/dL 123(H) 127(H) 127(H)  BUN 6 - 20 mg/dL 15 13 11   Creatinine 0.61 - 1.24 mg/dL  0.93 0.97 0.93  Sodium 135 - 145 mmol/L 134(L) 136 137  Potassium 3.5 - 5.1 mmol/L 3.8 3.9 4.2  Chloride 98 - 111 mmol/L 101 104 102  CO2 22 - 32 mmol/L 26 27 27   Calcium 8.9 - 10.3 mg/dL 8.6(L) 8.7(L) 8.8(L)  Total Protein 6.5 - 8.1 g/dL - - 6.4(L)  Total Bilirubin 0.3 - 1.2 mg/dL - - 1.2  Alkaline Phos 38 - 126 U/L - - 61  AST 15 - 41 U/L - - 12(L)  ALT 0 - 44 U/L - - 12      Radiology Studies: No results found.   Scheduled Meds:  feeding supplement  1 Container Oral BID BM   ferrous sulfate  325 mg Oral Q breakfast   mesalamine  2.4 g Oral BID   methylPREDNISolone (SOLU-MEDROL) injection  60 mg Intravenous Q12H   multivitamin with minerals  1 tablet Oral Daily   pantoprazole  40 mg Oral BID   vitamin B-12  100 mcg Oral Daily   Continuous Infusions:  lactated ringers 125 mL/hr at 06/15/21 1400     LOS: 2 days   Time spent: [place], MD [facility] To contact the attending provider between 7A-7P or the covering provider during after hours 7P-7A, please log into the web site [email or web address] and access using universal [place] password for that web site. If you do not have the password, please call the hospital operator.  06/15/2021, 5:33 PM

## 2021-06-16 ENCOUNTER — Inpatient Hospital Stay (HOSPITAL_COMMUNITY): Payer: BC Managed Care – PPO

## 2021-06-16 LAB — CBC WITH DIFFERENTIAL/PLATELET
Abs Immature Granulocytes: 0.09 10*3/uL — ABNORMAL HIGH (ref 0.00–0.07)
Basophils Absolute: 0.1 10*3/uL (ref 0.0–0.1)
Basophils Relative: 1 %
Eosinophils Absolute: 0 10*3/uL (ref 0.0–0.5)
Eosinophils Relative: 0 %
HCT: 39.4 % (ref 39.0–52.0)
Hemoglobin: 13.8 g/dL (ref 13.0–17.0)
Immature Granulocytes: 1 %
Lymphocytes Relative: 7 %
Lymphs Abs: 0.7 10*3/uL (ref 0.7–4.0)
MCH: 28.8 pg (ref 26.0–34.0)
MCHC: 35 g/dL (ref 30.0–36.0)
MCV: 82.1 fL (ref 80.0–100.0)
Monocytes Absolute: 0.9 10*3/uL (ref 0.1–1.0)
Monocytes Relative: 10 %
Neutro Abs: 7.1 10*3/uL (ref 1.7–7.7)
Neutrophils Relative %: 81 %
Platelets: 290 10*3/uL (ref 150–400)
RBC: 4.8 MIL/uL (ref 4.22–5.81)
RDW: 11.7 % (ref 11.5–15.5)
WBC: 8.8 10*3/uL (ref 4.0–10.5)
nRBC: 0 % (ref 0.0–0.2)

## 2021-06-16 LAB — PROTIME-INR
INR: 1.2 (ref 0.8–1.2)
Prothrombin Time: 15.4 seconds — ABNORMAL HIGH (ref 11.4–15.2)

## 2021-06-16 LAB — COMPREHENSIVE METABOLIC PANEL
ALT: 13 U/L (ref 0–44)
AST: 12 U/L — ABNORMAL LOW (ref 15–41)
Albumin: 3.7 g/dL (ref 3.5–5.0)
Alkaline Phosphatase: 59 U/L (ref 38–126)
Anion gap: 8 (ref 5–15)
BUN: 23 mg/dL — ABNORMAL HIGH (ref 6–20)
CO2: 26 mmol/L (ref 22–32)
Calcium: 8.9 mg/dL (ref 8.9–10.3)
Chloride: 103 mmol/L (ref 98–111)
Creatinine, Ser: 0.92 mg/dL (ref 0.61–1.24)
GFR, Estimated: >60 mL/min (ref >60)
Glucose, Bld: 128 mg/dL — ABNORMAL HIGH (ref 70–99)
Potassium: 4.2 mmol/L (ref 3.5–5.1)
Sodium: 137 mmol/L (ref 135–145)
Total Bilirubin: 1.6 mg/dL — ABNORMAL HIGH (ref 0.3–1.2)
Total Protein: 6.7 g/dL (ref 6.5–8.1)

## 2021-06-16 LAB — LACTOFERRIN, FECAL, QUALITATIVE: Lactoferrin, Fecal, Qual: POSITIVE — AB

## 2021-06-16 MED ORDER — PROCHLORPERAZINE EDISYLATE 10 MG/2ML IJ SOLN
10.0000 mg | Freq: Four times a day (QID) | INTRAMUSCULAR | Status: DC | PRN
  Administered 2021-06-16 – 2021-06-17 (×2): 10 mg via INTRAVENOUS
  Filled 2021-06-16 (×2): qty 2

## 2021-06-16 MED ORDER — SINCALIDE 5 MCG IJ SOLR
0.0200 ug/kg | Freq: Once | INTRAMUSCULAR | Status: AC
  Administered 2021-06-16: 1.2 ug via INTRAVENOUS

## 2021-06-16 MED ORDER — FLEET ENEMA 7-19 GM/118ML RE ENEM
1.0000 | ENEMA | Freq: Once | RECTAL | Status: AC
  Administered 2021-06-17: 1 via RECTAL
  Filled 2021-06-16: qty 1

## 2021-06-16 MED ORDER — PEG 3350-KCL-NA BICARB-NACL 420 G PO SOLR
4000.0000 mL | Freq: Once | ORAL | Status: DC
Start: 2021-06-16 — End: 2021-06-16

## 2021-06-16 MED ORDER — TECHNETIUM TC 99M MEBROFENIN IV KIT
5.5000 | PACK | Freq: Once | INTRAVENOUS | Status: AC
  Administered 2021-06-16: 5.5 via INTRAVENOUS

## 2021-06-16 NOTE — H&P (View-Only) (Signed)
[facility] Gastroenterology Progress Note  Gregory Ruiz 21 y.o. 2000/09/19  CC:  Ulcerative colitis   Subjective: Patient sleeping in bed this morning, parents at bedside.  Patient and family states that he had another rough night, waking up at 10 PM, 1 AM, and 5 AM with abdominal cramping and nausea with vomiting or dry heaving during bowel movements. Stools loose last night but patient denies any blood in the stool.  He has been nauseated to the point that he is unable to tolerate even water and has not been drinking much.  States antiemetics have not been providing much relief.  He denies any dysuria.  ROS : Review of Systems  Constitutional:  Negative for chills and fever.  Gastrointestinal:  Positive for abdominal pain, diarrhea, nausea and vomiting. Negative for blood in stool, constipation, heartburn and melena.  Genitourinary:  Negative for dysuria and urgency.     Objective: Vital signs in last 24 hours: Vitals:   06/15/21 0522 06/15/21 1218  BP: 131/78 106/63  Pulse: 68 (!) 55  Resp: 15 16  Temp: 98.8 F (37.1 C) 98 F (36.7 C)  SpO2: 98% 100%    Physical Exam:  General:  Alert, cooperative, no distress, thin, parents at bedside  Head:  Normocephalic, without obvious abnormality, atraumatic  Eyes:  Anicteric sclera, EOM's intact  Lungs:   Clear to auscultation bilaterally, respirations unlabored  Heart:  Regular rate and rhythm, S1, S2 normal  Abdomen:   Soft, non-tender, bowel sounds active all four quadrants,  no masses,   Extremities: Extremities normal, atraumatic, no  edema  Pulses: 2+ and symmetric    Lab Results: Recent Labs    06/14/21 1007 06/14/21 1145 06/16/21 0416  NA 134*  --  137  K 3.8  --  4.2  CL 101  --  103  CO2 26  --  26  GLUCOSE 123*  --  128*  BUN 15  --  23*  CREATININE 0.93  --  0.92  CALCIUM 8.6*  --  8.9  MG  --  2.2  --    Recent Labs    06/16/21 0416  AST 12*  ALT 13  ALKPHOS 59  BILITOT 1.6*  PROT 6.7  ALBUMIN 3.7    Recent Labs    06/14/21 1145 06/16/21 0416  WBC 7.0 8.8  NEUTROABS  --  7.1  HGB 12.5* 13.8  HCT 36.3* 39.4  MCV 83.6 82.1  PLT 266 290   Recent Labs    06/16/21 0416  LABPROT 15.4*  INR 1.2      Assessment Ulcerative colitis - CT abdomen pelvis with contrast: Long segment involvement: Distal transverse, descending, sigmoid, rectum - GI pathogen panel and C. difficile negative - No leukocytosis - Hgb 13.8 - BUN 23, Cr 0.92 - QuantiFERON-TB gold pending   Cystitis - Completed antibiotics - Dysuria improved - Negative for gonorrhea/chlamydia  Plan: Plan for Colonoscopy tomorrow. I thoroughly discussed the procedures to include nature, alternatives, benefits, and risks including but not limited to bleeding, perforation, infection, anesthesia/cardiac and pulmonary complications. Patient provides understanding and gave verbal consent to proceed. Continue Protonix 40 mg IV BID. Nulytely prep, clear liquid diet, NPO at midnight. Continue IV Solu-Medrol 60mg  every 12 hours Continue mesalamine 2.4G twice daily Can use Bentyl 20mg  every 4-6 hours as needed for abdominal pain Continue imodium 4mg  up to 12 tablets a day as needed for diarrhea [facility] will follow.       Angelique Holm PA-C 06/16/2021,  9:52 AM  Contact #  [phone]

## 2021-06-16 NOTE — Progress Notes (Signed)
[facility] Gastroenterology Progress Note  Gregory Ruiz 21 y.o. 2001/02/18  CC:  Ulcerative colitis   Subjective: Patient sleeping in bed this morning, parents at bedside.  Patient and family states that he had another rough night, waking up at 10 PM, 1 AM, and 5 AM with abdominal cramping and nausea with vomiting or dry heaving during bowel movements. Stools loose last night but patient denies any blood in the stool.  He has been nauseated to the point that he is unable to tolerate even water and has not been drinking much.  States antiemetics have not been providing much relief.  He denies any dysuria.  ROS : Review of Systems  Constitutional:  Negative for chills and fever.  Gastrointestinal:  Positive for abdominal pain, diarrhea, nausea and vomiting. Negative for blood in stool, constipation, heartburn and melena.  Genitourinary:  Negative for dysuria and urgency.     Objective: Vital signs in last 24 hours: Vitals:   06/15/21 0522 06/15/21 1218  BP: 131/78 106/63  Pulse: 68 (!) 55  Resp: 15 16  Temp: 98.8 F (37.1 C) 98 F (36.7 C)  SpO2: 98% 100%    Physical Exam:  General:  Alert, cooperative, no distress, thin, parents at bedside  Head:  Normocephalic, without obvious abnormality, atraumatic  Eyes:  Anicteric sclera, EOM's intact  Lungs:   Clear to auscultation bilaterally, respirations unlabored  Heart:  Regular rate and rhythm, S1, S2 normal  Abdomen:   Soft, non-tender, bowel sounds active all four quadrants,  no masses,   Extremities: Extremities normal, atraumatic, no  edema  Pulses: 2+ and symmetric    Lab Results: Recent Labs    06/14/21 1007 06/14/21 1145 06/16/21 0416  NA 134*  --  137  K 3.8  --  4.2  CL 101  --  103  CO2 26  --  26  GLUCOSE 123*  --  128*  BUN 15  --  23*  CREATININE 0.93  --  0.92  CALCIUM 8.6*  --  8.9  MG  --  2.2  --    Recent Labs    06/16/21 0416  AST 12*  ALT 13  ALKPHOS 59  BILITOT 1.6*  PROT 6.7  ALBUMIN 3.7    Recent Labs    06/14/21 1145 06/16/21 0416  WBC 7.0 8.8  NEUTROABS  --  7.1  HGB 12.5* 13.8  HCT 36.3* 39.4  MCV 83.6 82.1  PLT 266 290   Recent Labs    06/16/21 0416  LABPROT 15.4*  INR 1.2      Assessment Ulcerative colitis - CT abdomen pelvis with contrast: Long segment involvement: Distal transverse, descending, sigmoid, rectum - GI pathogen panel and C. difficile negative - No leukocytosis - Hgb 13.8 - BUN 23, Cr 0.92 - QuantiFERON-TB gold pending   Cystitis - Completed antibiotics - Dysuria improved - Negative for gonorrhea/chlamydia  Plan: Plan for Colonoscopy tomorrow. I thoroughly discussed the procedures to include nature, alternatives, benefits, and risks including but not limited to bleeding, perforation, infection, anesthesia/cardiac and pulmonary complications. Patient provides understanding and gave verbal consent to proceed. Continue Protonix 40 mg IV BID. Nulytely prep, clear liquid diet, NPO at midnight. Continue IV Solu-Medrol 60mg  every 12 hours Continue mesalamine 2.4G twice daily Can use Bentyl 20mg  every 4-6 hours as needed for abdominal pain Continue imodium 4mg  up to 12 tablets a day as needed for diarrhea [facility] will follow.       Angelique Holm PA-C 06/16/2021,  9:52 AM  Contact #  [phone]

## 2021-06-16 NOTE — Anesthesia Preprocedure Evaluation (Addendum)
Anesthesia Evaluation  Patient identified by MRN, date of birth, ID band Patient awake    Reviewed: Allergy & Precautions, NPO status , Patient's Chart, lab work & pertinent test results  History of Anesthesia Complications Negative for: history of anesthetic complications  Airway Mallampati: II  TM Distance: >3 FB Neck ROM: Full    Dental no notable dental hx. (+) Dental Advisory Given   Pulmonary asthma ,    Pulmonary exam normal        Cardiovascular negative cardio ROS Normal cardiovascular exam     Neuro/Psych negative neurological ROS     GI/Hepatic Neg liver ROS, PUD, UC   Endo/Other  negative endocrine ROS  Renal/GU negative Renal ROS     Musculoskeletal negative musculoskeletal ROS (+)   Abdominal   Peds  Hematology  (+) Blood dyscrasia, anemia ,   Anesthesia Other Findings   Reproductive/Obstetrics                            Anesthesia Physical Anesthesia Plan  ASA: 2  Anesthesia Plan: MAC   Post-op Pain Management: Minimal or no pain anticipated   Induction:   PONV Risk Score and Plan: 1 and Propofol infusion and Midazolam  Airway Management Planned: Natural Airway, Simple Face Mask and Nasal Cannula  Additional Equipment:   Intra-op Plan:   Post-operative Plan:   Informed Consent: I have reviewed the patients History and Physical, chart, labs and discussed the procedure including the risks, benefits and alternatives for the proposed anesthesia with the patient or authorized representative who has indicated his/her understanding and acceptance.     Dental advisory given  Plan Discussed with: Anesthesiologist and CRNA  Anesthesia Plan Comments:        Anesthesia Quick Evaluation

## 2021-06-16 NOTE — Progress Notes (Signed)
PROGRESS NOTE   Aaraf Plaza  [id number] DOB: 01-Dec-2000 DOA: 06/11/2021 PCP: Farris Has, MD  Brief Narrative:  21 year old white male Known ulcerative colitis with rectal bleeding followed by Dr. Levora [facility]--Focal Active colitis on [facility] 08/2020 + proctitis/ileitis--maintained on mesalamine 1.2g 2 tb /qd Acute intermittent asthma Seen in ED 06/07/2021 and started on prednisone-no improvement-decreased p.o. nausea vomiting?  Purulent penile discharge  Admit to/11 2/11 with nausea vomiting, inability to take oral steroids-CT scan ABD = long segment bowel thickening and mucosal hyperenhancement distal transverse descending sigmoid rectum without colovesical fistula on imaging  GI saw the patient-started on IV Solu-Medrol, started on higher dose of mesalamine 2.4 g twice daily Hepatitis panels were negative Treated for cystitis X 3 days Rocephin-resolved Hyperbilirubinemia??  Gilbert's disease  Patient's clinical progress has stalled since 2/14 and he has developed further nausea vomiting and GIs in the process of considering flexible sigmoidoscopy 2/17  Hospital-Problem based course  Ulcerative colitis flare Patient continues to suffer with cramping abdominal pain vomit--- now only retching-2 episodes of vomiting last night Lactoferrin unsurprisingly positive given inflammatory component to disease GI planning sigmoidoscopy 2/17- Per GI--meds currently include Solu-Medrol IV twice daily,  Imodium 2 mg as needed loose stool, Lialda 2.4 twice daily,  hyoscyamine 0.25 every 6 as needed cramp,  Compazine 10 every 6 as needed Cont LR 22ml/h Pain ctrl morphine 1 mg q4 prn Keep on clears if can tol ?  Cystitis Completed 3 days empiric antibiotics--GC was negative  Iron deficiency anemia 2/2 blood loss Iron level 27 ferritin 45-supplementation started continue B12 Stable asthma No need inhalers today  DVT prophylaxis: scd Code Status: full Family Communication: discussed  with patient's mom Joni Reining [phone] Disposition:  Status is: Inpatient Remains inpatient appropriate because:   Still not stable   Consultants:  Gastroenterology  Procedures:   Antimicrobials:     Subjective:  Several episodes vomit last pm No cp right now No chills no rigors  Objective: Vitals:   06/14/21 2211 06/15/21 0522 06/15/21 1218 06/16/21 1554  BP: 128/77 131/78 106/63 136/85  Pulse: 64 68 (!) 55 71  Resp: 16 15 16 17   Temp: 99.5 F (37.5 C) 98.8 F (37.1 C) 98 F (36.7 C) 97.8 F (36.6 C)  TempSrc: Oral Oral Oral Oral  SpO2: 99% 98% 100% 100%  Weight:      Height:        Intake/Output Summary (Last 24 hours) at 06/16/2021 1822 Last data filed at 06/16/2021 1400 Gross per 24 hour  Intake --  Output 0 ml  Net 0 ml    Filed Weights   06/13/21 0700  Weight: 59 kg    Examination:  Eomi ncat no iuct no pallor S1 s2 slight tachy Cta b no added sound  Abd soft, scaphoid , no HSM, no ascites-no clinical murphy sign, Neuro intact No LE edema  Data Reviewed: personally reviewed   CBC    Component Value Date/Time   WBC 8.8 06/16/2021 0416   RBC 4.80 06/16/2021 0416   HGB 13.8 06/16/2021 0416   HCT 39.4 06/16/2021 0416   PLT 290 06/16/2021 0416   MCV 82.1 06/16/2021 0416   MCH 28.8 06/16/2021 0416   MCHC 35.0 06/16/2021 0416   RDW 11.7 06/16/2021 0416   LYMPHSABS 0.7 06/16/2021 0416   MONOABS 0.9 06/16/2021 0416   EOSABS 0.0 06/16/2021 0416   BASOSABS 0.1 06/16/2021 0416   CMP Latest Ref Rng & Units 06/16/2021 06/14/2021 06/13/2021  Glucose 70 - 99 mg/dL 163(A)  123(H) 127(H)  BUN 6 - 20 mg/dL 23(H) 15 13  Creatinine 0.61 - 1.24 mg/dL 0.92 0.93 0.97  Sodium 135 - 145 mmol/L 137 134(L) 136  Potassium 3.5 - 5.1 mmol/L 4.2 3.8 3.9  Chloride 98 - 111 mmol/L 103 101 104  CO2 22 - 32 mmol/L 26 26 27   Calcium 8.9 - 10.3 mg/dL 8.9 8.6(L) 8.7(L)  Total Protein 6.5 - 8.1 g/dL 6.7 - -  Total Bilirubin 0.3 - 1.2 mg/dL 1.6(H) - -  Alkaline Phos  38 - 126 U/L 59 - -  AST 15 - 41 U/L 12(L) - -  ALT 0 - 44 U/L 13 - -     Radiology Studies: NM Hepato W/EF  Result Date: 06/16/2021 CLINICAL DATA:  Nausea and vomiting. EXAM: NUCLEAR MEDICINE HEPATOBILIARY IMAGING WITH GALLBLADDER EF TECHNIQUE: Sequential images of the abdomen were obtained out to 60 minutes following intravenous administration of radiopharmaceutical. After slow intravenous infusion of 1.2 mcg Cholecystokinin, gallbladder ejection fraction was determined. RADIOPHARMACEUTICALS:  5.5 mCi Tc-53m Choletec IV COMPARISON:  CT abdomen pelvis dated June 11, 2021. FINDINGS: Prompt uptake and biliary excretion of activity by the liver is seen. Gallbladder activity is visualized, consistent with patency of cystic duct. Biliary activity passes into small bowel, consistent with patent common bile duct. Calculated gallbladder ejection fraction is 42%. (At 60 min, normal ejection fraction is greater than 40%.) IMPRESSION: Normal hepatobiliary scan and gallbladder ejection fraction. Electronically Signed   By: Titus Dubin M.D.   On: 06/16/2021 15:47     Scheduled Meds:  feeding supplement  1 Container Oral BID BM   mesalamine  2.4 g Oral BID   methylPREDNISolone (SOLU-MEDROL) injection  60 mg Intravenous Q12H   multivitamin with minerals  1 tablet Oral Daily   pantoprazole  40 mg Oral BID   [START ON 06/17/2021] sodium phosphate  1 enema Rectal Once   vitamin B-12  100 mcg Oral Daily   Continuous Infusions:  lactated ringers 50 mL/hr at 06/16/21 0546     LOS: 3 days   Time spent: [place], MD [facility] To contact the attending provider between 7A-7P or the covering provider during after hours 7P-7A, please log into the web site [email or web address] and access using universal [place] password for that web site. If you do not have the password, please call the hospital operator.  06/16/2021, 6:22 PM

## 2021-06-17 ENCOUNTER — Encounter (HOSPITAL_COMMUNITY)
Admission: EM | Disposition: A | Discharge status: Home / Self Care | Payer: Self-pay | Source: Home / Self Care | Attending: Family Medicine

## 2021-06-17 ENCOUNTER — Encounter (HOSPITAL_COMMUNITY): Payer: Self-pay | Admitting: Internal Medicine

## 2021-06-17 ENCOUNTER — Inpatient Hospital Stay (HOSPITAL_COMMUNITY): Payer: BC Managed Care – PPO

## 2021-06-17 ENCOUNTER — Inpatient Hospital Stay (HOSPITAL_COMMUNITY): Payer: BC Managed Care – PPO | Admitting: Anesthesiology

## 2021-06-17 HISTORY — PX: COLONOSCOPY WITH PROPOFOL: SHX5780

## 2021-06-17 HISTORY — PX: BIOPSY: SHX5522

## 2021-06-17 LAB — THIOPURINE METHYLTRANSFERASE (TPMT), RBC: TPMT Activity:: 19.6 Units/mL RBC

## 2021-06-17 LAB — QUANTIFERON-TB GOLD PLUS (RQFGPL)
QuantiFERON Mitogen Value: 3.52 IU/mL
QuantiFERON Nil Value: 0 IU/mL
QuantiFERON TB1 Ag Value: 0 IU/mL
QuantiFERON TB2 Ag Value: 0 IU/mL

## 2021-06-17 LAB — QUANTIFERON-TB GOLD PLUS: QuantiFERON-TB Gold Plus: NEGATIVE

## 2021-06-17 SURGERY — SIGMOIDOSCOPY, FLEXIBLE
Anesthesia: Monitor Anesthesia Care

## 2021-06-17 SURGERY — BIOPSY
Anesthesia: Monitor Anesthesia Care

## 2021-06-17 MED ORDER — PROPOFOL 10 MG/ML IV BOLUS
INTRAVENOUS | Status: DC | PRN
  Administered 2021-06-17: 70 mg via INTRAVENOUS
  Administered 2021-06-17: 20 mg via INTRAVENOUS

## 2021-06-17 MED ORDER — PROPOFOL 500 MG/50ML IV EMUL
INTRAVENOUS | Status: DC | PRN
  Administered 2021-06-17: 150 ug/kg/min via INTRAVENOUS

## 2021-06-17 MED ORDER — LIDOCAINE 2% (20 MG/ML) 5 ML SYRINGE
INTRAMUSCULAR | Status: DC | PRN
Start: 2021-06-17 — End: 2021-06-17
  Administered 2021-06-17: 40 mg via INTRAVENOUS

## 2021-06-17 MED ORDER — LACTATED RINGERS IV SOLN: INTRAVENOUS | Status: DC | PRN

## 2021-06-17 NOTE — Progress Notes (Signed)
PROGRESS NOTE   Gregory Ruiz  [id number] DOB: 11-Nov-2000 DOA: 06/11/2021 PCP: [name], MD  Brief Narrative:  21 year old white male Known ulcerative colitis with rectal bleeding followed by Dr. [name] [facility]--Focal Active colitis on [facility] 08/2020 + proctitis/ileitis--maintained on mesalamine 1.2g 2 tb /qd Acute intermittent asthma Seen in ED 06/07/2021 and started on prednisone-no improvement-decreased p.o. nausea vomiting?  Purulent penile discharge  Admit to/11 2/11 with nausea vomiting, inability to take oral steroids-CT scan ABD = long segment bowel thickening and mucosal hyperenhancement distal transverse descending sigmoid rectum without colovesical fistula on imaging  GI saw the patient-started on IV Solu-Medrol, started on higher dose of mesalamine 2.4 g twice daily Hepatitis panels were negative Treated for cystitis X 3 days Rocephin-resolved Hyperbilirubinemia??  Gilbert's disease  Patient's clinical progress has stalled since 2/14 and he has developed further nausea vomiting and GIs in the process of considering flexible sigmoidoscopy 2/17  Hospital-Problem based course  Ulcerative colitis flare sigmoidoscopy 2/17 - Colonoscopy showed severe ulcerative colitis extending from rectum to cecum.  Normal terminal ileum.  Biopsies taken. HIDA scan from 2/716 was unsurprisingly negative Patient is feeling better 2/17 no nausea-less stools-has not had any vomiting or retching today Per GI--meds currently include Solu-Medrol IV twice daily,  Imodium 2 mg as needed loose stool, Lialda 2.4 twice daily,  hyoscyamine 0.25 every 6 as needed cramp,  Compazine 10 every 6 as needed Cont LR 155ml/h Pain ctrl morphine 1 mg q4 prn ?  Cystitis Completed 3 days empiric antibiotics--GC was negative  Iron deficiency anemia 2/2 blood loss Iron level 27 ferritin 45-supplementation started continue B12 Stable asthma No need inhalers today  DVT prophylaxis: scd Code Status:  full Family Communication: discussed with patient's father at the bedside in detail Mr. Mindi Slicker available at [phone] Disposition:  Status is: Inpatient Remains inpatient appropriate because:   Still not stable   Consultants:  Gastroenterology  Procedures:   Antimicrobials:     Subjective:  States feeling some better Understands need for biologic agent He is contemplating either Remicade every 8 weekly infusions versus Humira daily-I have asked him to think about this and take some time to think it over as we do not need to make a decision today He is very appreciative of the good care given by nursing staff and GI He has not had a whole lot of nausea vomiting today and feels somewhat better is taking clears he is passing flatus  Objective: Vitals:   06/17/21 0910 06/17/21 0920 06/17/21 0944 06/17/21 1316  BP: (!) 136/99 127/86 121/75 101/66  Pulse: 61 (!) 58 (!) 58 68  Resp: (!) 21 (!) 21 16 15   Temp:   98.6 F (37 C) 98 F (36.7 C)  TempSrc:   Oral Oral  SpO2: 100% 100% 100% 100%  Weight:      Height:        Intake/Output Summary (Last 24 hours) at 06/17/2021 1603 Last data filed at 06/17/2021 1300 Gross per 24 hour  Intake 1446.36 ml  Output 0 ml  Net 1446.36 ml    Filed Weights   06/13/21 0700  Weight: 59 kg    Examination:  Eomi ncat no ict S1 s2 slight tachy Cta b no added sound  Abd soft, scaphoid , no HSM, less tender Neuro intact no flap No LE edema  Data Reviewed: personally reviewed   CBC    Component Value Date/Time   WBC 8.8 06/16/2021 0416   RBC 4.80 06/16/2021 0416   HGB  13.8 06/16/2021 0416   HCT 39.4 06/16/2021 0416   PLT 290 06/16/2021 0416   MCV 82.1 06/16/2021 0416   MCH 28.8 06/16/2021 0416   MCHC 35.0 06/16/2021 0416   RDW 11.7 06/16/2021 0416   LYMPHSABS 0.7 06/16/2021 0416   MONOABS 0.9 06/16/2021 0416   EOSABS 0.0 06/16/2021 0416   BASOSABS 0.1 06/16/2021 0416   CMP Latest Ref Rng & Units 06/16/2021  06/14/2021 06/13/2021  Glucose 70 - 99 mg/dL 128(H) 123(H) 127(H)  BUN 6 - 20 mg/dL 23(H) 15 13  Creatinine 0.61 - 1.24 mg/dL 0.92 0.93 0.97  Sodium 135 - 145 mmol/L 137 134(L) 136  Potassium 3.5 - 5.1 mmol/L 4.2 3.8 3.9  Chloride 98 - 111 mmol/L 103 101 104  CO2 22 - 32 mmol/L 26 26 27   Calcium 8.9 - 10.3 mg/dL 8.9 8.6(L) 8.7(L)  Total Protein 6.5 - 8.1 g/dL 6.7 - -  Total Bilirubin 0.3 - 1.2 mg/dL 1.6(H) - -  Alkaline Phos 38 - 126 U/L 59 - -  AST 15 - 41 U/L 12(L) - -  ALT 0 - 44 U/L 13 - -     Radiology Studies: NM Hepato W/EF  Result Date: 06/16/2021 CLINICAL DATA:  Nausea and vomiting. EXAM: NUCLEAR MEDICINE HEPATOBILIARY IMAGING WITH GALLBLADDER EF TECHNIQUE: Sequential images of the abdomen were obtained out to 60 minutes following intravenous administration of radiopharmaceutical. After slow intravenous infusion of 1.2 mcg Cholecystokinin, gallbladder ejection fraction was determined. RADIOPHARMACEUTICALS:  5.5 mCi Tc-73m Choletec IV COMPARISON:  CT abdomen pelvis dated June 11, 2021. FINDINGS: Prompt uptake and biliary excretion of activity by the liver is seen. Gallbladder activity is visualized, consistent with patency of cystic duct. Biliary activity passes into small bowel, consistent with patent common bile duct. Calculated gallbladder ejection fraction is 42%. (At 60 min, normal ejection fraction is greater than 40%.) IMPRESSION: Normal hepatobiliary scan and gallbladder ejection fraction. Electronically Signed   By: Titus Dubin M.D.   On: 06/16/2021 15:47   DG CHEST PORT 1 VIEW  Result Date: 06/17/2021 CLINICAL DATA:  Screening for pulmonary TB. EXAM: PORTABLE CHEST 1 VIEW COMPARISON:  04/22/2007 FINDINGS: The heart size and mediastinal contours are within normal limits. Both lungs are clear. The visualized skeletal structures are unremarkable. IMPRESSION: No active disease. Electronically Signed   By: Misty Stanley M.D.   On: 06/17/2021 13:33     Scheduled Meds:   feeding supplement  1 Container Oral BID BM   methylPREDNISolone (SOLU-MEDROL) injection  60 mg Intravenous Q12H   multivitamin with minerals  1 tablet Oral Daily   pantoprazole  40 mg Oral BID   vitamin B-12  100 mcg Oral Daily   Continuous Infusions:  lactated ringers 125 mL/hr at 06/17/21 1238     LOS: 4 days   Time spent: [place], MD [facility] To contact the attending provider between 7A-7P or the covering provider during after hours 7P-7A, please log into the web site [email or web address] and access using universal [place] password for that web site. If you do not have the password, please call the hospital operator.  06/17/2021, 4:03 PM

## 2021-06-17 NOTE — Op Note (Addendum)
[facility] Patient Name: Gregory Ruiz Procedure Date: 06/17/2021 MRN: [id number] Attending MD: [name] , MD Date of Birth: 08-26-2000 CSN: [id number] Age: 21 Admit Type: Outpatient Procedure:                Colonoscopy Indications:              Rectal bleeding, Disease activity assessment of                            left-sided chronic ulcerative colitis Providers:                [name], MD, Jaci Carrel, RN, Benetta Spar, Technician Referring MD:              Medicines:                Sedation Administered by an Anesthesia Professional Complications:            No immediate complications. Estimated Blood Loss:     Estimated blood loss was minimal. Procedure:                Pre-Anesthesia Assessment:                           - Prior to the procedure, a History and Physical                            was performed, and patient medications and                            allergies were reviewed. The patient's tolerance of                            previous anesthesia was also reviewed. The risks                            and benefits of the procedure and the sedation                            options and risks were discussed with the patient.                            All questions were answered, and informed consent                            was obtained. Prior Anticoagulants: The patient has                            taken no previous anticoagulant or antiplatelet                            agents. ASA Grade Assessment: II - A patient with  mild systemic disease. After reviewing the risks                            and benefits, the patient was deemed in                            satisfactory condition to undergo the procedure.                           After obtaining informed consent, the colonoscope                            was passed under direct vision. Throughout the                             procedure, the patient's blood pressure, pulse, and                            oxygen saturations were monitored continuously. The                            [id number] [id number]) Olympus colonoscope was                            introduced through the anus and advanced to the the                            terminal ileum, with identification of the                            appendiceal orifice and IC valve. After obtaining                            informed consent, the colonoscope was passed under                            direct vision. Throughout the procedure, the                            patient's blood pressure, pulse, and oxygen                            saturations were monitored continuously. The                            colonoscopy was performed without difficulty. The                            patient tolerated the procedure well. The quality                            of the bowel preparation was fair. Scope In: 8:42:57 AM Scope Out: 8:56:07 AM Scope Withdrawal Time: 0 hours 9 minutes 14 seconds  Total Procedure Duration: 0 hours 13 minutes  10 seconds  Findings:      The perianal and digital rectal examinations were normal.      The terminal ileum appeared normal.      Inflammation was found in a continuous and circumferential pattern from       the rectum to the cecum. This was graded as Mayo Score 3 (severe, with       spontaneous bleeding, ulcerations), and when compared to the previous       examination, the findings are worsened. Multiple biopsies were obtained       in the rectum, in the sigmoid colon, in the descending colon, in the       transverse colon and in the ascending colon with cold forceps for       histology.      No additional abnormalities were found on retroflexion. Impression:               - Preparation of the colon was fair.                           - The examined portion of the ileum was normal.                            - Severe (Mayo Score 3) pancolitis ulcerative                            colitis, worsened since the last examination.                           - Multiple biopsies were obtained in the rectum, in                            the sigmoid colon, in the descending colon, in the                            transverse colon and in the ascending colon. Moderate Sedation:      Moderate (conscious) sedation was personally administered by an       anesthesia professional. The following parameters were monitored: oxygen       saturation, heart rate, blood pressure, and response to care. Recommendation:           - Return patient to hospital ward for ongoing care.                           - Full liquid diet.                           - Continue present medications.                           - Use Remicade (infliximab) intravenous infusion: 5                            mg / kg IV once per week at week 0, week 2, and  week 6. Procedure Code(s):        --- Professional ---                           [id number], Colonoscopy, flexible; with biopsy, single                            or multiple Diagnosis Code(s):        --- Professional ---                           K51.511, Left sided colitis with rectal bleeding                           K51.011, Ulcerative (chronic) pancolitis with                            rectal bleeding                           K62.5, Hemorrhage of anus and rectum CPT copyright 2019 [employer]. All rights reserved. The codes documented in this report are preliminary and upon coder review may  be revised to meet current compliance requirements. [name], MD [name], MD 06/17/2021 9:38:28 AM Number of Addenda: 0

## 2021-06-17 NOTE — Transfer of Care (Signed)
Immediate Anesthesia Transfer of Care Note  Patient: Gregory Ruiz  Procedure(s) Performed: BIOPSY COLONOSCOPY WITH PROPOFOL  Patient Location: PACU  Anesthesia Type:MAC  Level of Consciousness: awake, alert , oriented and patient cooperative  Airway & Oxygen Therapy: Patient Spontanous Breathing and Patient  Post-op Assessment: Report given to RN and Post -op Vital signs reviewed and stable  Post vital signs: Reviewed and stable  Last Vitals:  Vitals Value Taken Time  BP 135/94 06/17/21 0900  Temp    Pulse 66 06/17/21 0901  Resp 26 06/17/21 0901  SpO2 100 % 06/17/21 0901  Vitals shown include unvalidated device data.  Last Pain:  Vitals:   06/17/21 0806  TempSrc: Temporal  PainSc: 3       Patients Stated Pain Goal: 3 (02/18/10 7356)  Complications: No notable events documented.

## 2021-06-17 NOTE — Anesthesia Postprocedure Evaluation (Signed)
Anesthesia Post Note  Patient: Gregory Ruiz  Procedure(s) Performed: BIOPSY COLONOSCOPY WITH PROPOFOL     Patient location during evaluation: Endoscopy Anesthesia Type: MAC Level of consciousness: awake and alert Pain management: pain level controlled Vital Signs Assessment: post-procedure vital signs reviewed and stable Respiratory status: spontaneous breathing and respiratory function stable Cardiovascular status: stable Postop Assessment: no apparent nausea or vomiting Anesthetic complications: no   No notable events documented.  Last Vitals:  Vitals:   06/17/21 0920 06/17/21 0944  BP: 127/86 121/75  Pulse: (!) 58 (!) 58  Resp: (!) 21 16  Temp:  37 C  SpO2: 100% 100%    Last Pain:  Vitals:   06/17/21 0944  TempSrc: Oral  PainSc: 0-No pain                 Carron Jaggi DANIEL

## 2021-06-17 NOTE — Interval H&P Note (Signed)
History and Physical Interval Note:  06/17/2021 8:30 AM  Gregory Ruiz  has presented today for surgery, with the diagnosis of Ulcerative colitis.  The various methods of treatment have been discussed with the patient and family. After consideration of risks, benefits and other options for treatment, the patient has consented to  Procedure(s): FLEXIBLE SIGMOIDOSCOPY (N/A) as a surgical intervention.  The patient's history has been reviewed, patient examined, no change in status, stable for surgery.  I have reviewed the patient's chart and labs.  Questions were answered to the patient's satisfaction.     Sanders Manninen

## 2021-06-17 NOTE — Brief Op Note (Signed)
06/11/2021 - 06/17/2021  9:52 AM  PATIENT:  Gregory Ruiz  21 y.o. male  PRE-OPERATIVE DIAGNOSIS:  Ulcerative colitis  POST-OPERATIVE DIAGNOSIS:  COLON- Severe Ulcerative Colitits  PROCEDURE:  Procedure(s): BIOPSY COLONOSCOPY WITH PROPOFOL (N/A)  SURGEON:  Surgeon(s) and Role:    * Jayleah Garbers, MD - Primary   Findings --------------- - Colonoscopy showed severe ulcerative colitis extending from rectum to cecum.  Normal terminal ileum.  Biopsies taken.  Recommendation -------------------- -Continue IV Solu-Medrol 60 mg twice daily for now -I do not think his severe ulcerative colitis will improve with mesalamine or Imuran.  He will need biological agent. -Given his inability to tolerate oral intake, we may have to start biological agent while during hospitalization. -Discussed with patient's father.  We will start Remicade once TB Gold test comes back, hopefully in the next 1 to 2 days. -Meanwhile, recommend to keep him on clears to full liquid diet. -Stop mesalamine -GI will follow  [name] MD, [place] 06/17/2021, 9:54 AM  Contact #  [phone]

## 2021-06-18 LAB — CBC WITH DIFFERENTIAL/PLATELET
Abs Immature Granulocytes: 0.11 10*3/uL — ABNORMAL HIGH (ref 0.00–0.07)
Basophils Absolute: 0.1 10*3/uL (ref 0.0–0.1)
Basophils Relative: 1 %
Eosinophils Absolute: 0 10*3/uL (ref 0.0–0.5)
Eosinophils Relative: 0 %
HCT: 38.8 % — ABNORMAL LOW (ref 39.0–52.0)
Hemoglobin: 13.4 g/dL (ref 13.0–17.0)
Immature Granulocytes: 1 %
Lymphocytes Relative: 6 %
Lymphs Abs: 0.6 10*3/uL — ABNORMAL LOW (ref 0.7–4.0)
MCH: 28.2 pg (ref 26.0–34.0)
MCHC: 34.5 g/dL (ref 30.0–36.0)
MCV: 81.7 fL (ref 80.0–100.0)
Monocytes Absolute: 1.2 10*3/uL — ABNORMAL HIGH (ref 0.1–1.0)
Monocytes Relative: 13 %
Neutro Abs: 7.6 10*3/uL (ref 1.7–7.7)
Neutrophils Relative %: 79 %
Platelets: 274 10*3/uL (ref 150–400)
RBC: 4.75 MIL/uL (ref 4.22–5.81)
RDW: 11.9 % (ref 11.5–15.5)
Smear Review: NORMAL
WBC: 9.6 10*3/uL (ref 4.0–10.5)
nRBC: 0 % (ref 0.0–0.2)

## 2021-06-18 LAB — COMPREHENSIVE METABOLIC PANEL WITH GFR
ALT: 13 U/L (ref 0–44)
AST: 13 U/L — ABNORMAL LOW (ref 15–41)
Albumin: 3.6 g/dL (ref 3.5–5.0)
Alkaline Phosphatase: 57 U/L (ref 38–126)
Anion gap: 7 (ref 5–15)
BUN: 27 mg/dL — ABNORMAL HIGH (ref 6–20)
CO2: 27 mmol/L (ref 22–32)
Calcium: 8.8 mg/dL — ABNORMAL LOW (ref 8.9–10.3)
Chloride: 101 mmol/L (ref 98–111)
Creatinine, Ser: 0.99 mg/dL (ref 0.61–1.24)
GFR, Estimated: >60 mL/min (ref >60)
Glucose, Bld: 108 mg/dL — ABNORMAL HIGH (ref 70–99)
Potassium: 3.9 mmol/L (ref 3.5–5.1)
Sodium: 135 mmol/L (ref 135–145)
Total Bilirubin: 2.4 mg/dL — ABNORMAL HIGH (ref 0.3–1.2)
Total Protein: 6.5 g/dL (ref 6.5–8.1)

## 2021-06-18 NOTE — Progress Notes (Signed)
PROGRESS NOTE   Gregory Ruiz  [id number]:[id number] DOB: [birth year] DOA: 06/11/2021 PCP: Farris Has, MD  Brief Narrative:  21 year old white male Known ulcerative colitis with rectal bleeding followed by Dr. Levora [facility]--Focal Active colitis on [facility] 08/2020 + proctitis/ileitis--maintained on mesalamine 1.2g 2 tb /qd Acute intermittent asthma Seen in ED 06/07/2021 and started on prednisone-no improvement-decreased p.o. nausea vomiting?  Purulent penile discharge  Admit to/11 2/11 with nausea vomiting, inability to take oral steroids-CT scan ABD = long segment bowel thickening and mucosal hyperenhancement distal transverse descending sigmoid rectum without colovesical fistula on imaging  GI saw the patient-started on IV Solu-Medrol, started on higher dose of mesalamine 2.4 g twice daily Hepatitis panels were negative Treated for cystitis X 3 days Rocephin-resolved Hyperbilirubinemia??  Gilbert's disease  Patient's clinical progress has stalled since 2/14 and he has developed further nausea vomiting and GIs in the process of considering flexible sigmoidoscopy 2/17  Hospital-Problem based course  Ulcerative colitis flare sigmoidoscopy 2/17 - Colonoscopy showed severe ulcerative colitis extending from rectum to cecum.  Normal terminal ileum.  Biopsies taken. HIDA scan from 2/716 was unsurprisingly negative No n/v eatign food IVF -->50cc/h Per GI--meds per them Pain ctrl morphine 1 mg q4 prn May be able to hold of Remicaide/Humira--defer to GI? ?  Cystitis Completed 3 days empiric antibiotics--GC was negative  Iron deficiency anemia 2/2 blood loss Iron level 27 ferritin 45-supplementation started continue B12 Stable asthma No need inhalers today  DVT prophylaxis: scd Code Status: full Family Communication: discussed with patient's father /mother bedside 2/19 Disposition:  Status is: Inpatient Remains inpatient appropriate because:   Still not stable   Consultants:   Gastroenterology  Procedures:   Antimicrobials:     Subjective:  Much better Seen walking around and doing fair No cp n/v Eating I cut back fluids  Objective: Vitals:   06/17/21 1316 06/17/21 2100 06/18/21 0614 06/18/21 1316  BP: 101/66 101/61 (!) 92/52 109/68  Pulse: 68 67 76 71  Resp: 15 16 18 20   Temp: 98 F (36.7 C) 98.7 F (37.1 C) 98.5 F (36.9 C) 98.7 F (37.1 C)  TempSrc: Oral Oral Oral Oral  SpO2: 100% 100% 99% 100%  Weight:      Height:        Intake/Output Summary (Last 24 hours) at 06/18/2021 1832 Last data filed at 06/18/2021 1759 Gross per 24 hour  Intake 3473.46 ml  Output --  Net 3473.46 ml    Filed Weights   06/13/21 0700  Weight: 59 kg    Examination:  Eomi ncat no ict S1 s2  Cta b no added sound  Abd soft, scaphoid , no HSM, less tender in Lower quad although pressure makes him quesy Neuro intact no flap No LE edema  Data Reviewed: personally reviewed   CBC    Component Value Date/Time   WBC 9.6 06/18/2021 0536   RBC 4.75 06/18/2021 0536   HGB 13.4 06/18/2021 0536   HCT 38.8 (L) 06/18/2021 0536   PLT 274 06/18/2021 0536   MCV 81.7 06/18/2021 0536   MCH 28.2 06/18/2021 0536   MCHC 34.5 06/18/2021 0536   RDW 11.9 06/18/2021 0536   LYMPHSABS 0.6 (L) 06/18/2021 0536   MONOABS 1.2 (H) 06/18/2021 0536   EOSABS 0.0 06/18/2021 0536   BASOSABS 0.1 06/18/2021 0536   CMP Latest Ref Rng & Units 06/18/2021 06/16/2021 06/14/2021  Glucose 70 - 99 mg/dL 300(P) 233(A) 076(A)  BUN 6 - 20 mg/dL 26(J) 33(L) 15  Creatinine 0.61 - 1.24  mg/dL 0.99 0.92 0.93  Sodium 135 - 145 mmol/L 135 137 134(L)  Potassium 3.5 - 5.1 mmol/L 3.9 4.2 3.8  Chloride 98 - 111 mmol/L 101 103 101  CO2 22 - 32 mmol/L 27 26 26   Calcium 8.9 - 10.3 mg/dL 8.8(L) 8.9 8.6(L)  Total Protein 6.5 - 8.1 g/dL 6.5 6.7 -  Total Bilirubin 0.3 - 1.2 mg/dL 2.4(H) 1.6(H) -  Alkaline Phos 38 - 126 U/L 57 59 -  AST 15 - 41 U/L 13(L) 12(L) -  ALT 0 - 44 U/L 13 13 -     Radiology  Studies: DG CHEST PORT 1 VIEW  Result Date: 06/17/2021 CLINICAL DATA:  Screening for pulmonary TB. EXAM: PORTABLE CHEST 1 VIEW COMPARISON:  04/22/2007 FINDINGS: The heart size and mediastinal contours are within normal limits. Both lungs are clear. The visualized skeletal structures are unremarkable. IMPRESSION: No active disease. Electronically Signed   By: Misty Stanley M.D.   On: 06/17/2021 13:33     Scheduled Meds:  feeding supplement  1 Container Oral BID BM   methylPREDNISolone (SOLU-MEDROL) injection  60 mg Intravenous Q12H   multivitamin with minerals  1 tablet Oral Daily   pantoprazole  40 mg Oral BID   vitamin B-12  100 mcg Oral Daily   Continuous Infusions:  lactated ringers 125 mL/hr at 06/18/21 1115     LOS: 5 days   Time spent: [place], MD [facility] To contact the attending provider between 7A-7P or the covering provider during after hours 7P-7A, please log into the web site [email or web address] and access using universal [place] password for that web site. If you do not have the password, please call the hospital operator.  06/18/2021, 6:32 PM

## 2021-06-18 NOTE — Progress Notes (Signed)
Gregory Ruiz 11:09 AM  Subjective: Patient seen and examined in his hospital computer chart reviewed and his case discussed with my partner Dr. [name] as well as with his mother and father and he is in good spirits and we rediscussed colonoscopy and he had no problems from that and is tolerating a clear liquid diet and would like to try to eat more which we discussed and we had a long talk about the medicine options particularly the risk benefits and differences between Humira and Remicade and we answered all of their questions  Objective: Vital signs stable afebrile no acute distress abdomen is soft nontender chemistry and CBC okay hep B and TB test negative  Assessment: Ulcerative colitis  Plan: We will try soft solids but can return to clear liquids if needed and the family will call the insurance regarding Remicade which they prefer and I will check on tomorrow and decide whether we should start it here or set up as an outpatient  [name]  office [phone] After 5PM or if no answer call [phone]

## 2021-06-19 LAB — RENAL FUNCTION PANEL
Albumin: 3.1 g/dL — ABNORMAL LOW (ref 3.5–5.0)
Anion gap: 6 (ref 5–15)
BUN: 21 mg/dL — ABNORMAL HIGH (ref 6–20)
CO2: 26 mmol/L (ref 22–32)
Calcium: 8.3 mg/dL — ABNORMAL LOW (ref 8.9–10.3)
Chloride: 100 mmol/L (ref 98–111)
Creatinine, Ser: 0.85 mg/dL (ref 0.61–1.24)
GFR, Estimated: >60 mL/min (ref >60)
Glucose, Bld: 115 mg/dL — ABNORMAL HIGH (ref 70–99)
Phosphorus: 2.8 mg/dL (ref 2.5–4.6)
Potassium: 3.8 mmol/L (ref 3.5–5.1)
Sodium: 132 mmol/L — ABNORMAL LOW (ref 135–145)

## 2021-06-19 MED ORDER — SODIUM CHLORIDE 0.9 % IV SOLN: INTRAVENOUS | Status: DC

## 2021-06-19 NOTE — Progress Notes (Signed)
Josie Dixon 9:08 AM  Subjective: Patient doing better and tolerating diet and case discussed with his dad and did go a little more frequently at night but no new complaints and I did talk to his insurance company and Dr. [name] will need to call their insurance to get approval for the Remicade that they would like and we answered all of their questions  Objective: Vital signs stable afebrile no acute distress abdomen is soft nontender labs okay  Assessment: Ulcerative colitis  Plan: Hopefully he can go home tomorrow on 40 of prednisone and work with Dr. [name] to get approval for Remicade and get that started ASAP and we discussed how to slowly wean his prednisone in the meantime  [facility]  office [phone] After 5PM or if no answer call [phone]

## 2021-06-19 NOTE — Progress Notes (Signed)
PROGRESS NOTE   Gregory Ruiz  [id number]:[id number] DOB: 06/18/2000 DOA: 06/11/2021 PCP: Farris Has, MD  Brief Narrative:  21 year old white male Known ulcerative colitis with rectal bleeding followed by Dr. Levora [facility]--Focal Active colitis on [facility] 08/2020 + proctitis/ileitis--maintained on mesalamine 1.2g 2 tb /qd Acute intermittent asthma Seen in ED 06/07/2021 and started on prednisone-no improvement-decreased p.o. nausea vomiting?  Purulent penile discharge  Admit to/11 2/11 with nausea vomiting, inability to take oral steroids-CT scan ABD = long segment bowel thickening and mucosal hyperenhancement distal transverse descending sigmoid rectum without colovesical fistula on imaging  GI saw the patient-started on IV Solu-Medrol, started on higher dose of mesalamine 2.4 g twice daily Hepatitis panels were negative Treated for cystitis X 3 days Rocephin-resolved Hyperbilirubinemia??  Gilbert's disease  Patient's clinical progress has stalled since 2/14 and he has developed further nausea vomiting and GIs Flex sig was performed 2/17 He made rapid improvement subsequent to this and on 219 was tolerating diet without episodic pain nausea vomiting  Hospital-Problem based course  Ulcerative colitis flare sigmoidoscopy 2/17 - Colonoscopy showed severe ulcerative colitis extending from rectum to cecum.  Normal terminal ileum.  Biopsies taken. HIDA scan from 2/716 was unsurprisingly negative No n/v eatign food DC IV fluid completely today, steroid taper as per GI to prednisone 40 in am po--if stable and no distress with this change, home 2/20 Pain ctrl morphine 1 mg q4 prn Outpatient versus inpatient initiation of Remicade? ?  Cystitis Completed 3 days empiric antibiotics--GC was negative  Iron deficiency anemia 2/2 blood loss Iron level 27 ferritin 45-supplementation started continue B12 Stable asthma No need inhalers today  DVT prophylaxis: scd Code Status: full Family Communication:  discussed with patient's father /mother bedside 2/19  Disposition:  Status is: Inpatient Remains inpatient appropriate because:   Still not stable   Consultants:  Gastroenterology  Procedures:   Antimicrobials:     Subjective:  Eating drinking no pain no fever no chills no nausea no vomiting no melena-thinks that he can shower if given the opportunity-has been very stable and walked around several times yesterday  Objective: Vitals:   06/18/21 0614 06/18/21 1316 06/18/21 2021 06/19/21 0500  BP: (!) 92/52 109/68 126/78 122/75  Pulse: 76 71 81 78  Resp: 18 20 16 18   Temp: 98.5 F (36.9 C) 98.7 F (37.1 C) 98.4 F (36.9 C) 98.6 F (37 C)  TempSrc: Oral Oral Oral Oral  SpO2: 99% 100% 100% 100%  Weight:      Height:        Intake/Output Summary (Last 24 hours) at 06/19/2021 0931 Last data filed at 06/19/2021 0915 Gross per 24 hour  Intake 3239.8 ml  Output 0 ml  Net 3239.8 ml    Filed Weights   06/13/21 0700  Weight: 59 kg    Examination:  Pleasant coherent no distress EOMI NCAT no icterus pallor Abdomen scaphoid soft nontender no rebound or guarding ROM intact no lower extremity  Data Reviewed: personally reviewed   CBC    Component Value Date/Time   WBC 9.6 06/18/2021 0536   RBC 4.75 06/18/2021 0536   HGB 13.4 06/18/2021 0536   HCT 38.8 (L) 06/18/2021 0536   PLT 274 06/18/2021 0536   MCV 81.7 06/18/2021 0536   MCH 28.2 06/18/2021 0536   MCHC 34.5 06/18/2021 0536   RDW 11.9 06/18/2021 0536   LYMPHSABS 0.6 (L) 06/18/2021 0536   MONOABS 1.2 (H) 06/18/2021 0536   EOSABS 0.0 06/18/2021 0536   BASOSABS 0.1 06/18/2021 0536  CMP Latest Ref Rng & Units 06/19/2021 06/18/2021 06/16/2021  Glucose 70 - 99 mg/dL 115(H) 108(H) 128(H)  BUN 6 - 20 mg/dL 21(H) 27(H) 23(H)  Creatinine 0.61 - 1.24 mg/dL 0.85 0.99 0.92  Sodium 135 - 145 mmol/L 132(L) 135 137  Potassium 3.5 - 5.1 mmol/L 3.8 3.9 4.2  Chloride 98 - 111 mmol/L 100 101 103  CO2 22 - 32 mmol/L 26 27 26    Calcium 8.9 - 10.3 mg/dL 8.3(L) 8.8(L) 8.9  Total Protein 6.5 - 8.1 g/dL - 6.5 6.7  Total Bilirubin 0.3 - 1.2 mg/dL - 2.4(H) 1.6(H)  Alkaline Phos 38 - 126 U/L - 57 59  AST 15 - 41 U/L - 13(L) 12(L)  ALT 0 - 44 U/L - 13 13     Radiology Studies: DG CHEST PORT 1 VIEW  Result Date: 06/17/2021 CLINICAL DATA:  Screening for pulmonary TB. EXAM: PORTABLE CHEST 1 VIEW COMPARISON:  04/22/2007 FINDINGS: The heart size and mediastinal contours are within normal limits. Both lungs are clear. The visualized skeletal structures are unremarkable. IMPRESSION: No active disease. Electronically Signed   By: Misty Stanley M.D.   On: 06/17/2021 13:33     Scheduled Meds:  feeding supplement  1 Container Oral BID BM   methylPREDNISolone (SOLU-MEDROL) injection  60 mg Intravenous Q12H   multivitamin with minerals  1 tablet Oral Daily   pantoprazole  40 mg Oral BID   vitamin B-12  100 mcg Oral Daily   Continuous Infusions:  lactated ringers 50 mL/hr at 06/18/21 2123     LOS: 6 days   Time spent: [place], MD [facility] To contact the attending provider between 7A-7P or the covering provider during after hours 7P-7A, please log into the web site [email or web address] and access using universal [place] password for that web site. If you do not have the password, please call the hospital operator.  06/19/2021, 9:31 AM

## 2021-06-20 LAB — SURGICAL PATHOLOGY

## 2021-06-20 NOTE — Progress Notes (Signed)
[facility] Progress Note  Gregory Ruiz 21 y.o. 27-Apr-2001  CC: Ulcerative colitis flare   Subjective: Patient states he feels a little bit better compared to yesterday.  Slept better last night.  Still having bloody bowel movements.  Reports cramping abdominal pain worse with walking.  Worse prior to bowel movements, feels better after bowel movements.  Still having some nausea, this mainly occurs during the pain.  No vomiting.  Patient's mother called insurance company and Remicade is covered, they are working on a urgent prior authorization now.  ROS : Review of Systems  Gastrointestinal:  Positive for abdominal pain, blood in stool and diarrhea. Negative for constipation, heartburn, melena, nausea and vomiting.  Genitourinary:  Negative for dysuria and urgency.     Objective: Vital signs in last 24 hours: Vitals:   06/19/21 2138 06/20/21 0600  BP: 100/61 103/67  Pulse: 69 75  Resp: 17 17  Temp: 98.6 F (37 C) 98.3 F (36.8 C)  SpO2: 98% 98%    Physical Exam:  General:  Alert, cooperative, no distress, appears stated age  Head:  Normocephalic, without obvious abnormality, atraumatic  Eyes:  Anicteric sclera, EOM's intact  Lungs:   Clear to auscultation bilaterally, respirations unlabored  Heart:  Regular rate and rhythm, S1, S2 normal  Abdomen:   Soft, non-tender, bowel sounds active all four quadrants,  no masses,     Lab Results: Recent Labs    06/18/21 0536 06/19/21 0410  NA 135 132*  K 3.9 3.8  CL 101 100  CO2 27 26  GLUCOSE 108* 115*  BUN 27* 21*  CREATININE 0.99 0.85  CALCIUM 8.8* 8.3*  PHOS  --  2.8   Recent Labs    06/18/21 0536 06/19/21 0410  AST 13*  --   ALT 13  --   ALKPHOS 57  --   BILITOT 2.4*  --   PROT 6.5  --   ALBUMIN 3.6 3.1*   Recent Labs    06/18/21 0536  WBC 9.6  NEUTROABS 7.6  HGB 13.4  HCT 38.8*  MCV 81.7  PLT 274   No results for input(s): LABPROT, INR in the last 72 hours.    Assessment Ulcerative  colitis flare -CT abdomen pelvis: Long segment bowel thickening and mucosal hyperenhancement distal transverse descending sigmoid rectum without colovesical fistula. -Colonoscopy 06/17/2021: Fair prep.  Severe pan colitis ulcerative colitis, worsened since last colonoscopy 08/2020.  Multiple biopsies obtained.  Biopsies still pending -No leukocytosis   Plan: Continue IV Solu-Medrol.  Can be discharged on 40 Mg of prednisone.  Once Remicade prior authorization is approved, will get set up for Remicade as an outpatient Continue supportive care [facility] will sign off. Please contact us if we can be of any further assistance during this hospital stay.   Garnette Scheuermann PA-C 06/20/2021, 11:54 AM  Contact #  [phone]

## 2021-06-20 NOTE — Progress Notes (Signed)
PROGRESS NOTE   Gregory Ruiz  [id number]:[id number] DOB: 2001/01/23 DOA: 06/11/2021 PCP: Farris Has, MD  Brief Narrative:  21 year old white male Known ulcerative colitis with rectal bleeding followed by Dr. Levora [facility]--Focal Active colitis on [facility] 08/2020 + proctitis/ileitis--maintained on mesalamine 1.2g 2 tb /qd Acute intermittent asthma Seen in ED 06/07/2021 and started on prednisone-no improvement-decreased p.o. nausea vomiting?  Purulent penile discharge  Admit to/11 2/11 with nausea vomiting, inability to take oral steroids-CT scan ABD = long segment bowel thickening and mucosal hyperenhancement distal transverse descending sigmoid rectum without colovesical fistula on imaging  GI saw the patient-started on IV Solu-Medrol, started on higher dose of mesalamine 2.4 g twice daily Hepatitis panels were negative Treated for cystitis X 3 days Rocephin-resolved Hyperbilirubinemia??  Gilbert's disease  Patient's clinical progress has stalled since 2/14 and he has developed further nausea vomiting and GIs Flex sig was performed 2/17 Patient has had vacillating symptoms with intermittent diarrhea and improvement and then recurrences of spasm and pain  Hospital-Problem based course  Ulcerative colitis flare sigmoidoscopy 2/17 Colonoscopy showed severe ulcerative colitis extending from rectum to cecum.  Normal terminal ileum.  Biopsies taken. HIDA scan from 2/716 was unsurprisingly negative DC IV fluid 2/19, force PO oral fluid, steroid per GI --patient has had vacillation in his symptoms over the past several days that although he is slightly improved, I am not sure if he should discharge on orals without starting inpatient Remicade Completed 3 days empiric antibiotics--GC was negative  Iron deficiency anemia 2/2 blood loss Iron level 27 ferritin 45-supplementation started continue B12 Stable asthma No need inhalers today  DVT prophylaxis: scd Code Status: full Family Communication:  discussed with patient's mother bedside 2/19  Disposition:  Status is: Inpatient Remains inpatient appropriate because:   Still not stable   Consultants:  Gastroenterology  Procedures:   Antimicrobials:     Subjective:  3 loose stools with some blood yesterday and discomfort could not sleep properly last night Mom is in the room and wondering about the neck steps and plan-I am keeping patient on IV steroids until Dr. Dulce Sellar can see  Objective: Vitals:   06/19/21 1441 06/19/21 2138 06/20/21 0600 06/20/21 1317  BP: 113/86 100/61 103/67 112/73  Pulse: 96 69 75 93  Resp: 17 17 17 17   Temp: 98 F (36.7 C) 98.6 F (37 C) 98.3 F (36.8 C) 98.4 F (36.9 C)  TempSrc:  Oral Oral   SpO2: 100% 98% 98% 99%  Weight:      Height:        Intake/Output Summary (Last 24 hours) at 06/20/2021 1530 Last data filed at 06/20/2021 1515 Gross per 24 hour  Intake 774.4 ml  Output 250 ml  Net 524.4 ml    Filed Weights   06/13/21 0700  Weight: 59 kg    Examination:  Pleasant coherent no distress EOMI NCAT no icterus pallor Abdomen scaphoid soft slightly less tender ROM intact no lower extremity Neurologically intact  Data Reviewed: personally reviewed   CBC    Component Value Date/Time   WBC 9.6 06/18/2021 0536   RBC 4.75 06/18/2021 0536   HGB 13.4 06/18/2021 0536   HCT 38.8 (L) 06/18/2021 0536   PLT 274 06/18/2021 0536   MCV 81.7 06/18/2021 0536   MCH 28.2 06/18/2021 0536   MCHC 34.5 06/18/2021 0536   RDW 11.9 06/18/2021 0536   LYMPHSABS 0.6 (L) 06/18/2021 0536   MONOABS 1.2 (H) 06/18/2021 0536   EOSABS 0.0 06/18/2021 0536   BASOSABS 0.1  06/18/2021 0536   CMP Latest Ref Rng & Units 06/19/2021 06/18/2021 06/16/2021  Glucose 70 - 99 mg/dL 115(H) 108(H) 128(H)  BUN 6 - 20 mg/dL 21(H) 27(H) 23(H)  Creatinine 0.61 - 1.24 mg/dL 0.85 0.99 0.92  Sodium 135 - 145 mmol/L 132(L) 135 137  Potassium 3.5 - 5.1 mmol/L 3.8 3.9 4.2  Chloride 98 - 111 mmol/L 100 101 103  CO2 22 - 32  mmol/L 26 27 26   Calcium 8.9 - 10.3 mg/dL 8.3(L) 8.8(L) 8.9  Total Protein 6.5 - 8.1 g/dL - 6.5 6.7  Total Bilirubin 0.3 - 1.2 mg/dL - 2.4(H) 1.6(H)  Alkaline Phos 38 - 126 U/L - 57 59  AST 15 - 41 U/L - 13(L) 12(L)  ALT 0 - 44 U/L - 13 13     Radiology Studies: No results found.   Scheduled Meds:  feeding supplement  1 Container Oral BID BM   methylPREDNISolone (SOLU-MEDROL) injection  60 mg Intravenous Q12H   multivitamin with minerals  1 tablet Oral Daily   pantoprazole  40 mg Oral BID   vitamin B-12  100 mcg Oral Daily   Continuous Infusions:  sodium chloride 20 mL/hr at 06/19/21 2131     LOS: 7 days   Time spent: [place], MD [facility] To contact the attending provider between 7A-7P or the covering provider during after hours 7P-7A, please log into the web site [email or web address] and access using universal [place] password for that web site. If you do not have the password, please call the hospital operator.  06/20/2021, 3:30 PM

## 2021-06-21 LAB — COMPREHENSIVE METABOLIC PANEL
ALT: 20 U/L (ref 0–44)
AST: 14 U/L — ABNORMAL LOW (ref 15–41)
Albumin: 3.1 g/dL — ABNORMAL LOW (ref 3.5–5.0)
Alkaline Phosphatase: 52 U/L (ref 38–126)
Anion gap: 6 (ref 5–15)
BUN: 21 mg/dL — ABNORMAL HIGH (ref 6–20)
CO2: 25 mmol/L (ref 22–32)
Calcium: 8.4 mg/dL — ABNORMAL LOW (ref 8.9–10.3)
Chloride: 105 mmol/L (ref 98–111)
Creatinine, Ser: 0.71 mg/dL (ref 0.61–1.24)
GFR, Estimated: >60 mL/min (ref >60)
Glucose, Bld: 127 mg/dL — ABNORMAL HIGH (ref 70–99)
Potassium: 4.1 mmol/L (ref 3.5–5.1)
Sodium: 136 mmol/L (ref 135–145)
Total Bilirubin: 1.3 mg/dL — ABNORMAL HIGH (ref 0.3–1.2)
Total Protein: 5.8 g/dL — ABNORMAL LOW (ref 6.5–8.1)

## 2021-06-21 LAB — HEPATITIS PANEL, ACUTE
HCV Ab: NONREACTIVE
Hep A IgM: NONREACTIVE
Hep B C IgM: NONREACTIVE
Hepatitis B Surface Ag: NONREACTIVE

## 2021-06-21 LAB — HEPATITIS B CORE ANTIBODY, TOTAL: Hep B Core Total Ab: NONREACTIVE

## 2021-06-21 MED ORDER — PREDNISONE 20 MG PO TABS
40.0000 mg | ORAL_TABLET | Freq: Every day | ORAL | Status: DC
  Administered 2021-06-22 – 2021-06-24 (×3): 40 mg via ORAL
  Filled 2021-06-21 (×3): qty 2

## 2021-06-21 NOTE — Progress Notes (Signed)
[facility] Gastroenterology Progress Note  Gregory Ruiz 20 y.o. 2000-06-18  CC:  Ulcerative colitis flare   Subjective: Patient states he is doing much better today.  Having less abdominal cramping.  Abdominal cramping is mainly with gas and prior to bowel movements.  States he is having more formed bowel movements at this time and tolerating diet well.  ROS : Review of Systems  Gastrointestinal:  Positive for abdominal pain and blood in stool. Negative for constipation, diarrhea, heartburn, melena, nausea and vomiting.  Genitourinary:  Negative for dysuria and urgency.     Objective: Vital signs in last 24 hours: Vitals:   06/21/21 0445 06/21/21 0445  BP: 109/70 109/70  Pulse: 77 74  Resp: 16 16  Temp: 98 F (36.7 C) 98 F (36.7 C)  SpO2: 98% 98%    Physical Exam:  General:  Alert, cooperative, no distress, appears stated age  Head:  Normocephalic, without obvious abnormality, atraumatic  Eyes:  Anicteric sclera, EOM's intact  Lungs:   Clear to auscultation bilaterally, respirations unlabored  Heart:  Regular rate and rhythm, S1, S2 normal  Abdomen:   Soft, non-tender, bowel sounds active all four quadrants,  no masses,     Lab Results: Recent Labs    06/19/21 0410 06/21/21 0414  NA 132* 136  K 3.8 4.1  CL 100 105  CO2 26 25  GLUCOSE 115* 127*  BUN 21* 21*  CREATININE 0.85 0.71  CALCIUM 8.3* 8.4*  PHOS 2.8  --    Recent Labs    06/19/21 0410 06/21/21 0414  AST  --  14*  ALT  --  20  ALKPHOS  --  52  BILITOT  --  1.3*  PROT  --  5.8*  ALBUMIN 3.1* 3.1*   No results for input(s): WBC, NEUTROABS, HGB, HCT, MCV, PLT in the last 72 hours. No results for input(s): LABPROT, INR in the last 72 hours.    Assessment Ulcerative colitis flare -CT abdomen pelvis: Long segment bowel thickening and mucosal hyperenhancement distal transverse descending sigmoid rectum without colovesical fistula. -Colonoscopy 06/17/2021: Fair prep.  Severe pan colitis ulcerative  colitis, worsened since last colonoscopy 08/2020.  Multiple biopsies obtained.  Biopsies still pending -No leukocytosis   Plan: Ordered hepatitis B core antibody, hepatitis panel, hepatitis B surface antibody for prerequisite for starting Remicade.  Once these lab test come back we can start Remicade accordingly.  And then have him follow-up 2 weeks later for second dose as an outpatient Continue supportive care [facility] will follow  Garnette Scheuermann PA-C 06/21/2021, 10:23 AM  Contact #  [phone]

## 2021-06-21 NOTE — Progress Notes (Signed)
Nutrition Follow-up  INTERVENTION:   -Boost Breeze po BID, each supplement provides 250 kcal and 9 grams of protein   -Multivitamin with minerals daily   NUTRITION DIAGNOSIS:   Increased nutrient needs related to chronic illness as evidenced by estimated needs.  Ongoing.  GOAL:   Patient will meet greater than or equal to 90% of their needs  Progressing.  MONITOR:   PO intake, Supplement acceptance, Labs, Weight trends, I & O's  ASSESSMENT:   21 year old past medical history significant for asthma as a child, ulcerative colitis diagnosed 08/2018.  Mesalamine presenting with persistent symptoms diarrhea, abdominal pain bloody stools despite treatment with oral prednisone.     Patient admitted to 2/11 with flare of ulcerative colitis and cystitis.  Patient consuming 0-80% of meals. Not accepting [facility] everyday. Diarrhea and abdominal pain improving.   Admission weight: 130 lbs. No new weights this admission.  Medications: Multivitamin with minerals daily, Vitamin B-12  Labs reviewed.  Diet Order:   Diet Order             DIET SOFT Room service appropriate? Yes; Fluid consistency: Thin  Diet effective now                   EDUCATION NEEDS:   Education needs have been addressed  Skin:  Skin Assessment: Reviewed RN Assessment  Last BM:  2/21 -type 6  Height:   Ht Readings from Last 1 Encounters:  06/13/21 5\' 7"  (1.702 m) (18 %, Z= -0.93)*   * Growth percentiles are based on CDC (Boys, 2-20 Years) data.    Weight:   Wt Readings from Last 1 Encounters:  06/13/21 59 kg (11 %, Z= -1.20)*   * Growth percentiles are based on CDC (Boys, 2-20 Years) data.    BMI:  Body mass index is 20.37 kg/m.  Estimated Nutritional Needs:   Kcal:  1500-1700  Protein:  85-100g  Fluid:  1.7L/day  Clayton Bibles, MS, RD, LDN Inpatient Clinical Dietitian Contact information available via Amion

## 2021-06-21 NOTE — Progress Notes (Signed)
PROGRESS NOTE   Gregory Ruiz  [id number] DOB: 13-May-2000 DOA: 06/11/2021 PCP: [name], MD  Brief Narrative:  21 year old white male Known ulcerative colitis with rectal bleeding followed by Dr. [name] [facility]--Focal Active colitis on [facility] 08/2020 + proctitis/ileitis--maintained on mesalamine 1.2g 2 tb /qd Acute intermittent asthma Seen in ED 06/07/2021 and started on prednisone-no improvement-decreased p.o. nausea vomiting?  Purulent penile discharge  Admit to/11 2/11 with nausea vomiting, inability to take oral steroids-CT scan ABD = long segment bowel thickening and mucosal hyperenhancement distal transverse descending sigmoid rectum without colovesical fistula on imaging  GI saw the patient-started on IV Solu-Medrol, started on higher dose of mesalamine 2.4 g twice daily Hepatitis panels were negative Treated for cystitis X 3 days Rocephin-resolved Hyperbilirubinemia??  Gilbert's disease  Patient's clinical progress has stalled since 2/14 and he has developed further nausea vomiting and GIs Flex sig was performed 2/17 Patient has had vacillating symptoms with intermittent diarrhea and improvement and then recurrences of spasm and pain  Hospital-Problem based course  Ulcerative colitis flare 2/17 Colonoscopy showed severe ulcerative colitis extending from rectum to cecum.  Normal terminal ileum.  Biopsies -active chronic colitis=crohn's HIDA scan from 2/716 was unsurprisingly negative DC IV fluid 2/19, force PO oral fluid, steroid and tapering per GI --patient has had vacillation in his symptoms,needs inpatient Remicade Completed 3 days empiric antibiotics--GC was negative  Iron deficiency anemia 2/2 blood loss Iron level 27 ferritin 45-supplementation started continue B12 Stable asthma No need inhalers today  DVT prophylaxis: scd Code Status: full Family Communication: discussed with patient's father 2/19  Disposition:  Status is: Inpatient Remains inpatient  appropriate because:   Still not stable   Consultants:  Gastroenterology  Procedures:   Antimicrobials:     Subjective:  Improved today pain is less, 2 unformed  Yesterday had pain and some blood in stool No cp fever  Objective: Vitals:   06/20/21 2052 06/21/21 0445 06/21/21 0445 06/21/21 1314  BP: 117/69 109/70 109/70 111/71  Pulse: 79 77 74 99  Resp: 16 16 16 14   Temp: 98.7 F (37.1 C) 98 F (36.7 C) 98 F (36.7 C) 97.9 F (36.6 C)  TempSrc: Oral Oral Oral   SpO2: 98% 98% 98% 99%  Weight:      Height:        Intake/Output Summary (Last 24 hours) at 06/21/2021 1541 Last data filed at 06/21/2021 1400 Gross per 24 hour  Intake 1214.58 ml  Output 350 ml  Net 864.58 ml    Filed Weights   06/13/21 0700  Weight: 59 kg    Examination:  Pleasant coherent no distress EOMI NCAT no icterus pallor Abdomen scaphoid soft slightly less tender ROM intact no lower extremity Neurologically intact  Data Reviewed: personally reviewed   CBC    Component Value Date/Time   WBC 9.6 06/18/2021 0536   RBC 4.75 06/18/2021 0536   HGB 13.4 06/18/2021 0536   HCT 38.8 (L) 06/18/2021 0536   PLT 274 06/18/2021 0536   MCV 81.7 06/18/2021 0536   MCH 28.2 06/18/2021 0536   MCHC 34.5 06/18/2021 0536   RDW 11.9 06/18/2021 0536   LYMPHSABS 0.6 (L) 06/18/2021 0536   MONOABS 1.2 (H) 06/18/2021 0536   EOSABS 0.0 06/18/2021 0536   BASOSABS 0.1 06/18/2021 0536   CMP Latest Ref Rng & Units 06/21/2021 06/19/2021 06/18/2021  Glucose 70 - 99 mg/dL 127(H) 115(H) 108(H)  BUN 6 - 20 mg/dL 21(H) 21(H) 27(H)  Creatinine 0.61 - 1.24 mg/dL 0.71 0.85 0.99  Sodium 135 - 145 mmol/L 136 132(L) 135  Potassium 3.5 - 5.1 mmol/L 4.1 3.8 3.9  Chloride 98 - 111 mmol/L 105 100 101  CO2 22 - 32 mmol/L 25 26 27   Calcium 8.9 - 10.3 mg/dL 8.4(L) 8.3(L) 8.8(L)  Total Protein 6.5 - 8.1 g/dL 5.8(L) - 6.5  Total Bilirubin 0.3 - 1.2 mg/dL 1.3(H) - 2.4(H)  Alkaline Phos 38 - 126 U/L 52 - 57  AST 15 - 41 U/L  14(L) - 13(L)  ALT 0 - 44 U/L 20 - 13     Radiology Studies: No results found.   Scheduled Meds:  feeding supplement  1 Container Oral BID BM   multivitamin with minerals  1 tablet Oral Daily   pantoprazole  40 mg Oral BID   [START ON 06/22/2021] predniSONE  40 mg Oral Q breakfast   vitamin B-12  100 mcg Oral Daily   Continuous Infusions:  sodium chloride 20 mL/hr at 06/19/21 2131     LOS: 8 days   Time spent: [place], MD [facility] To contact the attending provider between 7A-7P or the covering provider during after hours 7P-7A, please log into the web site [email or web address] and access using universal [place] password for that web site. If you do not have the password, please call the hospital operator.  06/21/2021, 3:41 PM

## 2021-06-22 LAB — COMPREHENSIVE METABOLIC PANEL
ALT: 21 U/L (ref 0–44)
AST: 16 U/L (ref 15–41)
Albumin: 3 g/dL — ABNORMAL LOW (ref 3.5–5.0)
Alkaline Phosphatase: 53 U/L (ref 38–126)
Anion gap: 4 — ABNORMAL LOW (ref 5–15)
BUN: 19 mg/dL (ref 6–20)
CO2: 26 mmol/L (ref 22–32)
Calcium: 8.2 mg/dL — ABNORMAL LOW (ref 8.9–10.3)
Chloride: 106 mmol/L (ref 98–111)
Creatinine, Ser: 0.76 mg/dL (ref 0.61–1.24)
GFR, Estimated: >60 mL/min (ref >60)
Glucose, Bld: 102 mg/dL — ABNORMAL HIGH (ref 70–99)
Potassium: 3.8 mmol/L (ref 3.5–5.1)
Sodium: 136 mmol/L (ref 135–145)
Total Bilirubin: 1.1 mg/dL (ref 0.3–1.2)
Total Protein: 5.6 g/dL — ABNORMAL LOW (ref 6.5–8.1)

## 2021-06-22 LAB — CBC WITH DIFFERENTIAL/PLATELET
Abs Immature Granulocytes: 0.1 10*3/uL — ABNORMAL HIGH (ref 0.00–0.07)
Basophils Absolute: 0 10*3/uL (ref 0.0–0.1)
Basophils Relative: 0 %
Eosinophils Absolute: 0 10*3/uL (ref 0.0–0.5)
Eosinophils Relative: 0 %
HCT: 35.3 % — ABNORMAL LOW (ref 39.0–52.0)
Hemoglobin: 12.6 g/dL — ABNORMAL LOW (ref 13.0–17.0)
Immature Granulocytes: 1 %
Lymphocytes Relative: 6 %
Lymphs Abs: 0.7 10*3/uL (ref 0.7–4.0)
MCH: 29 pg (ref 26.0–34.0)
MCHC: 35.7 g/dL (ref 30.0–36.0)
MCV: 81.1 fL (ref 80.0–100.0)
Monocytes Absolute: 1.1 10*3/uL — ABNORMAL HIGH (ref 0.1–1.0)
Monocytes Relative: 10 %
Neutro Abs: 9.3 10*3/uL — ABNORMAL HIGH (ref 1.7–7.7)
Neutrophils Relative %: 83 %
Platelets: 230 10*3/uL (ref 150–400)
RBC: 4.35 MIL/uL (ref 4.22–5.81)
RDW: 12 % (ref 11.5–15.5)
WBC: 11.3 10*3/uL — ABNORMAL HIGH (ref 4.0–10.5)
nRBC: 0 % (ref 0.0–0.2)

## 2021-06-22 LAB — HEPATITIS B SURFACE ANTIBODY, QUANTITATIVE: Hep B S AB Quant (Post): <3.1 m[IU]/mL — ABNORMAL LOW (ref >9.9)

## 2021-06-22 MED ORDER — FAMOTIDINE 20 MG PO TABS
20.0000 mg | ORAL_TABLET | Freq: Once | ORAL | Status: AC
Start: 2021-06-23 — End: 2021-06-23
  Administered 2021-06-23: 20 mg via ORAL
  Filled 2021-06-22: qty 1

## 2021-06-22 MED ORDER — ACETAMINOPHEN 325 MG PO TABS
325.0000 mg | ORAL_TABLET | Freq: Once | ORAL | Status: DC
  Filled 2021-06-22: qty 1

## 2021-06-22 MED ORDER — ALUM & MAG HYDROXIDE-SIMETH 200-200-20 MG/5ML PO SUSP
30.0000 mL | Freq: Four times a day (QID) | ORAL | Status: DC | PRN
  Administered 2021-06-22: 30 mL via ORAL
  Filled 2021-06-22: qty 30

## 2021-06-22 MED ORDER — SODIUM CHLORIDE 0.9 % IV SOLN
5.0000 mg/kg | Freq: Once | INTRAVENOUS | Status: AC
  Administered 2021-06-23: 300 mg via INTRAVENOUS
  Filled 2021-06-22: qty 30

## 2021-06-22 MED ORDER — SODIUM CHLORIDE 0.9 % IV SOLN: 5.0000 mg/kg | Freq: Once | INTRAVENOUS | Status: DC

## 2021-06-22 NOTE — Progress Notes (Signed)
PROGRESS NOTE    Gregory Ruiz  [id number] DOB: 04-Jun-2000 DOA: 06/11/2021 PCP: [name], MD  Chief Complaint  Patient presents with   Blood In Stools    Brief Narrative:  21 yo with hx ulcerative colitis who presented with crohn's flare.  GI following and planning for remicade.  See below for additional details    Assessment & Plan:   Principal Problem:   Ulcerative colitis ([place]) Active Problems:   Cystitis   Hyperbilirubinemia   Asthma, chronic   Iron deficiency anemia due to chronic blood loss   Assessment and Plan: * Ulcerative colitis ([place])- (present on admission) Presented with UC flare CT with long segment bowe wall thickening with mucosal hyperenhancement involving the distal transverse colon, descending colon, sigmoid colon and rectum Negative c diff and GI pathogen panel from 2/11 Colonoscopy 2/17 with severe pancolitis ulcerative colitis 2/17 bx with focal acute colitis, active chronic colitis with architectural changes c/w ulcerative colitis, active chronic colitis with architectural changes c/w ulcerative colitis Transitioned to oral steroids today Planning for remicade per GI  Symptoms worse today, will defer adjustment in therapy to GI at this point  Cystitis He reported dysuria, and small amount of purulent drainage from penis at presentation CT with mild diffuse bladder wall thickening UA bland, not c/w UTI GC/chlamydia negative S/p 3 days ceftriaxone  Hyperbilirubinemia Resolved, follow  Asthma, chronic No  recent exacerbation.  As needed albuterol. Stable.  Iron deficiency anemia due to chronic blood loss Patient hb relatively stable at 12.  He probably has chronic blood loss from colitis.  Iron level low at 27, ferritin 45.  Started  Iron supplement.  Started  B 12 supplement.  Follow folate level   DVT prophylaxis: SCD, ambulate Code Status: full Family Communication: father at bedside Disposition:   Status is:  Inpatient Remains inpatient appropriate because: continued UC flare   Consultants:  GI  Procedures:  Impression - Preparation of the colon was fair. - The examined portion of the ileum was normal. - Severe (Mayo Score 3) pancolitis ulcerative colitis, worsened since the last examination. - Multiple biopsies were obtained in the rectum, in the sigmoid colon, in the descending colon, in the transverse colon and in the ascending colon. Recommendation - Return patient to hospital ward for ongoing care. - Full liquid diet. - Continue present medications. - Use Remicade (infliximab) intravenous infusion: 5 mg / kg IV once per week at week 0, week 2, and week 6.  Antimicrobials:  Anti-infectives (From admission, onward)    Start     Dose/Rate Route Frequency Ordered Stop   06/12/21 1400  cefTRIAXone (ROCEPHIN) 1 g in sodium chloride 0.9 % 100 mL IVPB  Status:  Discontinued        1 g 200 mL/hr over 30 Minutes Intravenous Every 24 hours 06/11/21 1524 06/14/21 1046   06/11/21 1415  cefTRIAXone (ROCEPHIN) 1 g in sodium chloride 0.9 % 100 mL IVPB        1 g 200 mL/hr over 30 Minutes Intravenous  Once 06/11/21 1411 06/11/21 1506       Subjective: Feels more "down" today More abdominal discomfort, less appetite More diarrhea and bloody stool   Objective: Vitals:   06/21/21 1314 06/21/21 2201 06/22/21 0524 06/22/21 1300  BP: 111/71 126/77 119/79 122/77  Pulse: 99 78 75 (!) 108  Resp: 14  14 14   Temp: 97.9 F (36.6 C) 98.2 F (36.8 C) 97.8 F (36.6 C) 98.6 F (37 C)  TempSrc:  Oral Oral Oral  SpO2: 99% 98% 100% 100%  Weight:      Height:        Intake/Output Summary (Last 24 hours) at 06/22/2021 1333 Last data filed at 06/22/2021 1300 Gross per 24 hour  Intake 1347.36 ml  Output 750 ml  Net 597.36 ml   Filed Weights   06/13/21 0700  Weight: 59 kg    Examination:  General exam: Appears calm and comfortable  Respiratory system: unlabored Cardiovascular system:  RRR Gastrointestinal system: mild discomfort Central nervous system: Alert and oriented. No focal neurological deficits. Extremities: no LEE  Data Reviewed: I have personally reviewed following labs and imaging studies  CBC: Recent Labs  Lab 06/16/21 0416 06/18/21 0536 06/22/21 0431  WBC 8.8 9.6 11.3*  NEUTROABS 7.1 7.6 9.3*  HGB 13.8 13.4 12.6*  HCT 39.4 38.8* 35.3*  MCV 82.1 81.7 81.1  PLT 290 274 [id number]    Basic Metabolic Panel: Recent Labs  Lab 06/16/21 0416 06/18/21 0536 06/19/21 0410 06/21/21 0414 06/22/21 0431  NA 137 135 132* 136 136  K 4.2 3.9 3.8 4.1 3.8  CL 103 101 100 105 106  CO2 26 27 26 25 26   GLUCOSE 128* 108* 115* 127* 102*  BUN 23* 27* 21* 21* 19  CREATININE 0.92 0.99 0.85 0.71 0.76  CALCIUM 8.9 8.8* 8.3* 8.4* 8.2*  PHOS  --   --  2.8  --   --     GFR: Estimated Creatinine Clearance: 123.9 mL/min (by C-G formula based on SCr of 0.76 mg/dL).  Liver Function Tests: Recent Labs  Lab 06/16/21 0416 06/18/21 0536 06/19/21 0410 06/21/21 0414 06/22/21 0431  AST 12* 13*  --  14* 16  ALT 13 13  --  20 21  ALKPHOS 59 57  --  52 53  BILITOT 1.6* 2.4*  --  1.3* 1.1  PROT 6.7 6.5  --  5.8* 5.6*  ALBUMIN 3.7 3.6 3.1* 3.1* 3.0*    CBG: No results for input(s): GLUCAP in the last 168 hours.   No results found for this or any previous visit (from the past 240 hour(s)).       Radiology Studies: No results found.      Scheduled Meds:  feeding supplement  1 Container Oral BID BM   multivitamin with minerals  1 tablet Oral Daily   pantoprazole  40 mg Oral BID   predniSONE  40 mg Oral Q breakfast   vitamin B-12  100 mcg Oral Daily   Continuous Infusions:  sodium chloride 20 mL/hr at 06/19/21 2131     LOS: 9 days    Time spent: over 30 min    Fayrene Helper, MD [facility]   To contact the attending provider between 7A-7P or the covering provider during after hours 7P-7A, please log into the web site [email or web address] and  access using universal [place] password for that web site. If you do not have the password, please call the hospital operator.  06/22/2021, 1:33 PM

## 2021-06-22 NOTE — Plan of Care (Signed)

## 2021-06-22 NOTE — Progress Notes (Signed)
[facility] Gastroenterology Progress Note  Gregory Ruiz 21 y.o. [birth year]  CC:  Ulcerative colitis   Subjective: Patient resting in bed this morning, his father is at bedside.  Patient states he was doing well yesterday but worsened overnight.  He reports having 3 loose bowel movements with some bright red blood in each.  Also having associated fecal urgency and abdominal cramping.  Denies nausea or vomiting.  ROS : Review of Systems  Constitutional:  Negative for chills and fever.  Gastrointestinal:  Positive for abdominal pain, blood in stool and diarrhea. Negative for constipation, heartburn, melena, nausea and vomiting.  Genitourinary:  Negative for dysuria and urgency.     Objective: Vital signs in last 24 hours: Vitals:   06/21/21 2201 06/22/21 0524  BP: 126/77 119/79  Pulse: 78 75  Resp:  14  Temp: 98.2 F (36.8 C) 97.8 F (36.6 C)  SpO2: 98% 100%    Physical Exam:  General:  Alert, cooperative, no distress, appears stated age  Head:  Normocephalic, without obvious abnormality, atraumatic  Eyes:  Anicteric sclera, EOM's intact  Lungs:   Clear to auscultation bilaterally, respirations unlabored  Heart:  Regular rate and rhythm, S1, S2 normal  Abdomen:   Soft, non-tender, bowel sounds active all four quadrants,  no masses,   Extremities: Extremities normal, atraumatic, no  edema  Pulses: 2+ and symmetric    Lab Results: Recent Labs    06/21/21 0414 06/22/21 0431  NA 136 136  K 4.1 3.8  CL 105 106  CO2 25 26  GLUCOSE 127* 102*  BUN 21* 19  CREATININE 0.71 0.76  CALCIUM 8.4* 8.2*   Recent Labs    06/21/21 0414 06/22/21 0431  AST 14* 16  ALT 20 21  ALKPHOS 52 53  BILITOT 1.3* 1.1  PROT 5.8* 5.6*  ALBUMIN 3.1* 3.0*   Recent Labs    06/22/21 0431  WBC 11.3*  NEUTROABS 9.3*  HGB 12.6*  HCT 35.3*  MCV 81.1  PLT 230   No results for input(s): LABPROT, INR in the last 72 hours.    Assessment Ulcerative colitis flare - CT abdomen pelvis: Long  segment bowel thickening and mucosal hyperenhancement distal transverse descending sigmoid rectum without colovesical fistula. - Colonoscopy 06/17/2021: Fair prep.  Severe pan colitis ulcerative colitis, worsened since last colonoscopy 08/2020.  Biopsies show active chronic colitis consistent with ulcerative colitis, no dysplasia or malignancy identified. - WBC elevated today at 11.3, vitals stable - Hgb 12.6 - BUN 19, Cr 0.76 - Hepatitis panels negative   Plan: Hepatitis panel negative. Surface antibody <3.1, nonimmune. Can consider starting infliximab today. Patient would need follow up in 2 weeks outpatient for second dose. Continue supportive care. [facility] will follow.  Angelique Holm PA-C 06/22/2021, 10:50 AM  Contact #  [phone]

## 2021-06-23 DIAGNOSIS — R000 Tachycardia, unspecified: Secondary | ICD-10-CM

## 2021-06-23 LAB — CBC WITH DIFFERENTIAL/PLATELET
Abs Immature Granulocytes: 0.05 10*3/uL (ref 0.00–0.07)
Basophils Absolute: 0 10*3/uL (ref 0.0–0.1)
Basophils Relative: 0 %
Eosinophils Absolute: 0 10*3/uL (ref 0.0–0.5)
Eosinophils Relative: 0 %
HCT: 39.7 % (ref 39.0–52.0)
Hemoglobin: 13.8 g/dL (ref 13.0–17.0)
Immature Granulocytes: 1 %
Lymphocytes Relative: 6 %
Lymphs Abs: 0.4 10*3/uL — ABNORMAL LOW (ref 0.7–4.0)
MCH: 28.6 pg (ref 26.0–34.0)
MCHC: 34.8 g/dL (ref 30.0–36.0)
MCV: 82.2 fL (ref 80.0–100.0)
Monocytes Absolute: 0.8 10*3/uL (ref 0.1–1.0)
Monocytes Relative: 10 %
Neutro Abs: 6 10*3/uL (ref 1.7–7.7)
Neutrophils Relative %: 83 %
Platelets: 220 10*3/uL (ref 150–400)
RBC: 4.83 MIL/uL (ref 4.22–5.81)
RDW: 12.1 % (ref 11.5–15.5)
WBC: 7.3 10*3/uL (ref 4.0–10.5)
nRBC: 0 % (ref 0.0–0.2)

## 2021-06-23 LAB — COMPREHENSIVE METABOLIC PANEL
ALT: 25 U/L (ref 0–44)
AST: 20 U/L (ref 15–41)
Albumin: 3.3 g/dL — ABNORMAL LOW (ref 3.5–5.0)
Alkaline Phosphatase: 61 U/L (ref 38–126)
Anion gap: 5 (ref 5–15)
BUN: 19 mg/dL (ref 6–20)
CO2: 30 mmol/L (ref 22–32)
Calcium: 8.4 mg/dL — ABNORMAL LOW (ref 8.9–10.3)
Chloride: 100 mmol/L (ref 98–111)
Creatinine, Ser: 0.86 mg/dL (ref 0.61–1.24)
GFR, Estimated: >60 mL/min (ref >60)
Glucose, Bld: 111 mg/dL — ABNORMAL HIGH (ref 70–99)
Potassium: 3.7 mmol/L (ref 3.5–5.1)
Sodium: 135 mmol/L (ref 135–145)
Total Bilirubin: 1.7 mg/dL — ABNORMAL HIGH (ref 0.3–1.2)
Total Protein: 6.5 g/dL (ref 6.5–8.1)

## 2021-06-23 LAB — FOLATE: Folate: 8.4 ng/mL (ref >5.9)

## 2021-06-23 LAB — PHOSPHORUS: Phosphorus: 3.9 mg/dL (ref 2.5–4.6)

## 2021-06-23 LAB — MAGNESIUM: Magnesium: 2.4 mg/dL (ref 1.7–2.4)

## 2021-06-23 MED ORDER — LACTATED RINGERS IV BOLUS
1000.0000 mL | Freq: Once | INTRAVENOUS | Status: AC
  Administered 2021-06-23: 1000 mL via INTRAVENOUS

## 2021-06-23 MED ORDER — LACTATED RINGERS IV SOLN: INTRAVENOUS | Status: AC

## 2021-06-23 NOTE — Assessment & Plan Note (Addendum)
Related to dehydration with poor PO intake vs anxiety?  No CP or SOB or LE edema EKG with sinus tach, no priors for comparison TSH wnl Good response to IVF with HR into the 60's Mildly tachy again today with more tachy when he was up and walking, improved again with IVF Encouraged PO intake at discharge

## 2021-06-23 NOTE — Progress Notes (Signed)
[facility] Gastroenterology Progress Note  Gregory Ruiz 21 y.o. 03-Jul-2000  CC:  Ulcerative colitis   Subjective: Patient resting in bed, parents at bedside.  States he had 2 bloody bowel movements last night with associated abdominal cramping, but states overall he is feeling better today.  Denies nausea, vomiting, fever, chills.  He reports he has had an elevated heart rate, had an EKG just before I entered the room.  Had not yet received infliximab/biosimilar at the time of my visit.  ROS : Review of Systems  Constitutional:  Negative for chills and fever.  Gastrointestinal:  Positive for abdominal pain, blood in stool and diarrhea. Negative for constipation, heartburn, melena, nausea and vomiting.  Genitourinary:  Negative for dysuria and urgency.     Objective: Vital signs in last 24 hours: Vitals:   06/23/21 0545 06/23/21 1255  BP: 112/68 107/67  Pulse: (!) 105 (!) 105  Resp: 16 18  Temp: 98.6 F (37 C) 98.7 F (37.1 C)  SpO2: 99% 100%    Physical Exam:  General:  Alert, cooperative, no distress, appears stated age  Head:  Normocephalic, without obvious abnormality, atraumatic  Eyes:  Anicteric sclera, EOM's intact  Lungs:   Clear to auscultation bilaterally, respirations unlabored  Heart:  Regular rate and rhythm, S1, S2 normal  Abdomen:   Soft, non-tender, bowel sounds active all four quadrants,  no masses,   Extremities: Extremities normal, atraumatic, no  edema  Pulses: 2+ and symmetric    Lab Results: Recent Labs    06/22/21 0431 06/23/21 0430  NA 136 135  K 3.8 3.7  CL 106 100  CO2 26 30  GLUCOSE 102* 111*  BUN 19 19  CREATININE 0.76 0.86  CALCIUM 8.2* 8.4*  MG  --  2.4  PHOS  --  3.9   Recent Labs    06/22/21 0431 06/23/21 0430  AST 16 20  ALT 21 25  ALKPHOS 53 61  BILITOT 1.1 1.7*  PROT 5.6* 6.5  ALBUMIN 3.0* 3.3*   Recent Labs    06/22/21 0431 06/23/21 0430  WBC 11.3* 7.3  NEUTROABS 9.3* 6.0  HGB 12.6* 13.8  HCT 35.3* 39.7  MCV 81.1  82.2  PLT 230 220   No results for input(s): LABPROT, INR in the last 72 hours.    Assessment Ulcerative colitis flare - CT abdomen pelvis: Long segment bowel thickening and mucosal hyperenhancement distal transverse descending sigmoid rectum without colovesical fistula. - Colonoscopy 06/17/2021: Fair prep.  Severe pan colitis ulcerative colitis, worsened since last colonoscopy 08/2020.  Biopsies show active chronic colitis consistent with ulcerative colitis, no dysplasia or malignancy identified. - WBC 7.3 - Hgb 13.8 - BUN 19, Cr 0.86 - Hepatitis panels negative - EKG for tachycardia done today, results pending   Plan: Planning to start infliximab/biosimilar today. Continue supportive care. [facility] will follow.  Angelique Holm PA-C 06/23/2021, 1:59 PM  Contact #  [phone]

## 2021-06-23 NOTE — Progress Notes (Signed)
RN spoke with pharmacy and telemetry is needed to administer Inflectra, MD notified. A transfer ordered was placed, awaiting telemetry bed availability.

## 2021-06-23 NOTE — Progress Notes (Signed)
PROGRESS NOTE    Gregory Ruiz  [id number] DOB: 2000/09/02 DOA: 06/11/2021 PCP: [name], MD  Chief Complaint  Patient presents with   Blood In Stools    Brief Narrative:  21 yo with hx ulcerative colitis who presented with crohn's flare.  GI following and planning for remicade.  See below for additional details    Assessment & Plan:   Principal Problem:   Ulcerative colitis ([place]) Active Problems:   Sinus tachycardia   Hyperbilirubinemia   Cystitis   Asthma, chronic   Iron deficiency anemia due to chronic blood loss   Assessment and Plan: * Ulcerative colitis ([place])- (present on admission) Presented with UC flare CT with long segment bowe wall thickening with mucosal hyperenhancement involving the distal transverse colon, descending colon, sigmoid colon and rectum Negative c diff and GI pathogen panel from 2/11 Colonoscopy 2/17 with severe pancolitis ulcerative colitis 2/17 bx with focal acute colitis, active chronic colitis with architectural changes c/w ulcerative colitis, active chronic colitis with architectural changes c/w ulcerative colitis Transitioned to oral steroids  remicade 2/23 per GI Appreciate GI assistance   Sinus tachycardia Related to dehydration with poor PO intake vs anxiety?  No CP or SOB EKG with sinus tach, no priors for comparison Will check TSH Bolus and follow response to IVF   Cystitis He reported dysuria, and small amount of purulent drainage from penis at presentation CT with mild diffuse bladder wall thickening UA bland, not c/w UTI GC/chlamydia negative S/p 3 days ceftriaxone  Hyperbilirubinemia Some fluctuation, trend  Asthma, chronic No  recent exacerbation.  As needed albuterol. Stable.  Iron deficiency anemia due to chronic blood loss Patient hb relatively stable at 12.  He probably has chronic blood loss from colitis.  Iron level low at 27, ferritin 45.  Started  Iron supplement.  Started  B 12 supplement.   Follow folate level wnl   DVT prophylaxis: SCD, ambulate 3x daily Code Status: full Family Communication: father, mother at bedside Disposition:   Status is: Inpatient Remains inpatient appropriate because: continued UC flare   Consultants:  GI  Procedures:  Impression - Preparation of the colon was fair. - The examined portion of the ileum was normal. - Severe (Mayo Score 3) pancolitis ulcerative colitis, worsened since the last examination. - Multiple biopsies were obtained in the rectum, in the sigmoid colon, in the descending colon, in the transverse colon and in the ascending colon. Recommendation - Return patient to hospital ward for ongoing care. - Full liquid diet. - Continue present medications. - Use Remicade (infliximab) intravenous infusion: 5 mg / kg IV once per week at week 0, week 2, and week 6.  Antimicrobials:  Anti-infectives (From admission, onward)    Start     Dose/Rate Route Frequency Ordered Stop   06/12/21 1400  cefTRIAXone (ROCEPHIN) 1 g in sodium chloride 0.9 % 100 mL IVPB  Status:  Discontinued        1 g 200 mL/hr over 30 Minutes Intravenous Every 24 hours 06/11/21 1524 06/14/21 1046   06/11/21 1415  cefTRIAXone (ROCEPHIN) 1 g in sodium chloride 0.9 % 100 mL IVPB        1 g 200 mL/hr over 30 Minutes Intravenous  Once 06/11/21 1411 06/11/21 1506       Subjective: Feels better today, Gregory Ruiz little fatigued with exertion  Objective: Vitals:   06/23/21 1404 06/23/21 1450 06/23/21 1545 06/23/21 1758  BP: 126/75  111/72 108/89  Pulse: (!) 116  100 98  Resp: 18 (!) 21 (!) 22 20  Temp: 99.1 F (37.3 C)  99.3 F (37.4 C) 99.5 F (37.5 C)  TempSrc: Oral  Oral Oral  SpO2: 99%  99% 99%  Weight:      Height:        Intake/Output Summary (Last 24 hours) at 06/23/2021 1947 Last data filed at 06/23/2021 1800 Gross per 24 hour  Intake 1885.79 ml  Output 0 ml  Net 1885.79 ml   Filed Weights   06/13/21 0700  Weight: 59 kg     Examination:  General: No acute distress. Cardiovascular: tachy, regular Lungs: ctab Abdomen: soft, improved TTP Neurological: Alert and oriented 3. Moves all extremities 4. Cranial nerves II through XII grossly intact. Skin: Warm and dry. No rashes or lesions. Extremities: No clubbing or cyanosis. No edema.   Data Reviewed: I have personally reviewed following labs and imaging studies  CBC: Recent Labs  Lab 06/18/21 0536 06/22/21 0431 06/23/21 0430  WBC 9.6 11.3* 7.3  NEUTROABS 7.6 9.3* 6.0  HGB 13.4 12.6* 13.8  HCT 38.8* 35.3* 39.7  MCV 81.7 81.1 82.2  PLT 274 230 [id number]    Basic Metabolic Panel: Recent Labs  Lab 06/18/21 0536 06/19/21 0410 06/21/21 0414 06/22/21 0431 06/23/21 0430  NA 135 132* 136 136 135  K 3.9 3.8 4.1 3.8 3.7  CL 101 100 105 106 100  CO2 27 26 25 26 30   GLUCOSE 108* 115* 127* 102* 111*  BUN 27* 21* 21* 19 19  CREATININE 0.99 0.85 0.71 0.76 0.86  CALCIUM 8.8* 8.3* 8.4* 8.2* 8.4*  MG  --   --   --   --  2.4  PHOS  --  2.8  --   --  3.9    GFR: Estimated Creatinine Clearance: 115.3 mL/min (by C-G formula based on SCr of 0.86 mg/dL).  Liver Function Tests: Recent Labs  Lab 06/18/21 0536 06/19/21 0410 06/21/21 0414 06/22/21 0431 06/23/21 0430  AST 13*  --  14* 16 20  ALT 13  --  20 21 25   ALKPHOS 57  --  52 53 61  BILITOT 2.4*  --  1.3* 1.1 1.7*  PROT 6.5  --  5.8* 5.6* 6.5  ALBUMIN 3.6 3.1* 3.1* 3.0* 3.3*    CBG: No results for input(s): GLUCAP in the last 168 hours.   No results found for this or any previous visit (from the past 240 hour(s)).       Radiology Studies: No results found.      Scheduled Meds:  acetaminophen  325 mg Oral Once   feeding supplement  1 Container Oral BID BM   multivitamin with minerals  1 tablet Oral Daily   pantoprazole  40 mg Oral BID   predniSONE  40 mg Oral Q breakfast   vitamin B-12  100 mcg Oral Daily   Continuous Infusions:  lactated ringers 100 mL/hr at 06/23/21 1801      LOS: 10 days    Time spent: over 30 min    Fayrene Helper, MD [facility]   To contact the attending provider between 7A-7P or the covering provider during after hours 7P-7A, please log into the web site [email or web address] and access using universal  password for that web site. If you do not have the password, please call the hospital operator.  06/23/2021, 7:47 PM

## 2021-06-24 LAB — CBC WITH DIFFERENTIAL/PLATELET
Abs Immature Granulocytes: 0.05 10*3/uL (ref 0.00–0.07)
Basophils Absolute: 0 10*3/uL (ref 0.0–0.1)
Basophils Relative: 0 %
Eosinophils Absolute: 0.1 10*3/uL (ref 0.0–0.5)
Eosinophils Relative: 2 %
HCT: 29.6 % — ABNORMAL LOW (ref 39.0–52.0)
Hemoglobin: 10.4 g/dL — ABNORMAL LOW (ref 13.0–17.0)
Immature Granulocytes: 1 %
Lymphocytes Relative: 15 %
Lymphs Abs: 0.9 10*3/uL (ref 0.7–4.0)
MCH: 29.1 pg (ref 26.0–34.0)
MCHC: 35.1 g/dL (ref 30.0–36.0)
MCV: 82.9 fL (ref 80.0–100.0)
Monocytes Absolute: 0.8 10*3/uL (ref 0.1–1.0)
Monocytes Relative: 13 %
Neutro Abs: 4.1 10*3/uL (ref 1.7–7.7)
Neutrophils Relative %: 69 %
Platelets: 163 10*3/uL (ref 150–400)
RBC: 3.57 MIL/uL — ABNORMAL LOW (ref 4.22–5.81)
RDW: 11.9 % (ref 11.5–15.5)
WBC: 6 10*3/uL (ref 4.0–10.5)
nRBC: 0 % (ref 0.0–0.2)

## 2021-06-24 LAB — COMPREHENSIVE METABOLIC PANEL
ALT: 20 U/L (ref 0–44)
AST: 16 U/L (ref 15–41)
Albumin: 2.5 g/dL — ABNORMAL LOW (ref 3.5–5.0)
Alkaline Phosphatase: 46 U/L (ref 38–126)
Anion gap: 4 — ABNORMAL LOW (ref 5–15)
BUN: 15 mg/dL (ref 6–20)
CO2: 29 mmol/L (ref 22–32)
Calcium: 7.9 mg/dL — ABNORMAL LOW (ref 8.9–10.3)
Chloride: 103 mmol/L (ref 98–111)
Creatinine, Ser: 0.81 mg/dL (ref 0.61–1.24)
GFR, Estimated: >60 mL/min (ref >60)
Glucose, Bld: 96 mg/dL (ref 70–99)
Potassium: 3.4 mmol/L — ABNORMAL LOW (ref 3.5–5.1)
Sodium: 136 mmol/L (ref 135–145)
Total Bilirubin: 1.3 mg/dL — ABNORMAL HIGH (ref 0.3–1.2)
Total Protein: 4.8 g/dL — ABNORMAL LOW (ref 6.5–8.1)

## 2021-06-24 LAB — MAGNESIUM: Magnesium: 2.3 mg/dL (ref 1.7–2.4)

## 2021-06-24 LAB — PHOSPHORUS: Phosphorus: 2.6 mg/dL (ref 2.5–4.6)

## 2021-06-24 LAB — TSH: TSH: 0.686 u[IU]/mL (ref 0.350–4.500)

## 2021-06-24 MED ORDER — POTASSIUM CHLORIDE CRYS ER 20 MEQ PO TBCR
40.0000 meq | EXTENDED_RELEASE_TABLET | Freq: Once | ORAL | Status: AC
Start: 2021-06-24 — End: 2021-06-24
  Administered 2021-06-24: 40 meq via ORAL
  Filled 2021-06-24: qty 2

## 2021-06-24 MED ORDER — FERROUS SULFATE 325 (65 FE) MG PO TABS: 325.0000 mg | ORAL_TABLET | Freq: Every day | ORAL | 0 refills | Status: AC

## 2021-06-24 MED ORDER — LACTATED RINGERS IV BOLUS
500.0000 mL | Freq: Once | INTRAVENOUS | Status: AC
  Administered 2021-06-24: 500 mL via INTRAVENOUS

## 2021-06-24 MED ORDER — PANTOPRAZOLE SODIUM 40 MG PO TBEC: 40.0000 mg | DELAYED_RELEASE_TABLET | Freq: Every day | ORAL | 0 refills | Status: AC

## 2021-06-24 MED ORDER — LACTATED RINGERS IV BOLUS
1000.0000 mL | Freq: Once | INTRAVENOUS | Status: AC
  Administered 2021-06-24: 1000 mL via INTRAVENOUS

## 2021-06-24 MED ORDER — ONDANSETRON 4 MG PO TBDP: 4.0000 mg | ORAL_TABLET | Freq: Three times a day (TID) | ORAL | 0 refills | Status: DC | PRN

## 2021-06-24 MED ORDER — PREDNISONE 20 MG PO TABS: 40.0000 mg | ORAL_TABLET | Freq: Every day | ORAL | 0 refills | Status: AC

## 2021-06-24 NOTE — Progress Notes (Signed)
[facility] Gastroenterology Progress Note  Gregory Ruiz 21 y.o. [birth year]  CC:  Ulcerative colitis   Subjective: Patient resting in bed this morning, his father is at bedside.  Patient states he is doing much better today.  He had 1 bowel movement last night and 3 bowel movements this morning but states he is having less abdominal cramping and less blood in his bowel movements.  Denies nausea or vomiting, tolerating his diet.  ROS : Review of Systems  Constitutional:  Negative for chills and fever.  Respiratory:  Negative for shortness of breath.   Cardiovascular:  Negative for chest pain and leg swelling.  Gastrointestinal:  Positive for abdominal pain (cramping with bowel movements, improved) and blood in stool. Negative for constipation, diarrhea, heartburn, melena, nausea and vomiting.  Genitourinary:  Negative for dysuria and urgency.     Objective: Vital signs in last 24 hours: Vitals:   06/24/21 0156 06/24/21 1050  BP: 108/67 112/70  Pulse: 65 (!) 118  Resp: 19   Temp: 98.1 F (36.7 C) 98.4 F (36.9 C)  SpO2: 100% 100%    Physical Exam:  General:  Alert, cooperative, no distress, appears stated age  Head:  Normocephalic, without obvious abnormality, atraumatic  Eyes:  Anicteric sclera, EOM's intact  Lungs:   Clear to auscultation bilaterally, respirations unlabored  Heart:  Regular rate and rhythm, S1, S2 normal  Abdomen:   Soft, non-tender, bowel sounds active all four quadrants,  no masses,   Extremities: Extremities normal, atraumatic, no  edema  Pulses: 2+ and symmetric    Lab Results: Recent Labs    06/23/21 0430 06/24/21 0500  NA 135 136  K 3.7 3.4*  CL 100 103  CO2 30 29  GLUCOSE 111* 96  BUN 19 15  CREATININE 0.86 0.81  CALCIUM 8.4* 7.9*  MG 2.4 2.3  PHOS 3.9 2.6   Recent Labs    06/23/21 0430 06/24/21 0500  AST 20 16  ALT 25 20  ALKPHOS 61 46  BILITOT 1.7* 1.3*  PROT 6.5 4.8*  ALBUMIN 3.3* 2.5*   Recent Labs    06/23/21 0430  06/24/21 0500  WBC 7.3 6.0  NEUTROABS 6.0 4.1  HGB 13.8 10.4*  HCT 39.7 29.6*  MCV 82.2 82.9  PLT 220 163   No results for input(s): LABPROT, INR in the last 72 hours.    Assessment Ulcerative colitis flare - CT abdomen pelvis: Long segment bowel thickening and mucosal hyperenhancement distal transverse descending sigmoid rectum without colovesical fistula. - Colonoscopy 06/17/2021: Fair prep.  Severe pan colitis ulcerative colitis, worsened since last colonoscopy 08/2020.  Biopsies show active chronic colitis consistent with ulcerative colitis, no dysplasia or malignancy identified. - WBC 6.0 - Hgb 10.4 - BUN 15, Cr 0.81 - Hepatitis panels negative - EKG for tachycardia done yesterday, results pending  Plan: Continue supportive care, fluids as needed. Continue diet as tolerated.  If patient does well today, can consider discharge in the next day or two from a GI standpoint. Will need close outpatient follow up with Dr. [name] and next IV infusion Inflectra in 2 weeks. [facility] will follow.  Angelique Holm PA-C 06/24/2021, 1:22 PM  Contact #  [phone]

## 2021-06-24 NOTE — Discharge Summary (Signed)
Physician Discharge Summary  Gregory Ruiz [id number] DOB: 01-04-2001 DOA: 06/11/2021  PCP: [name], MD  Admit date: 06/11/2021 Discharge date: 06/24/2021  Time spent: 40 minutes  Recommendations for Outpatient Follow-up:  Follow outpatient CBC/CMP  Follow with gastroenterology outpatient Follow prednisone dosing outpatient with gastroenterology Sinus tach, volume responsive, improved at discharge, follow outpatient   Discharge Diagnoses:  Principal Problem:   Ulcerative colitis ([place]) Active Problems:   Sinus tachycardia   Hyperbilirubinemia   Cystitis   Asthma, chronic   Iron deficiency anemia due to chronic blood loss   Discharge Condition: stable  Diet recommendation: heart healthy  Filed Weights   06/13/21 0700  Weight: 59 kg    History of present illness:  21 yo with hx ulcerative colitis who presented with crohn's flare.  GI following and planning for remicade.  He's improved with steroids and was given remicade on 2/23.  He's improved today.  Planning for discharge with outpatient follow up.  He's had intermittent tachycardia, improved with IVF.    See below for additional details  Hospital Course:  Assessment and Plan: * Ulcerative colitis ([place])- (present on admission) Presented with UC flare CT with long segment bowe wall thickening with mucosal hyperenhancement involving the distal transverse colon, descending colon, sigmoid colon and rectum Negative c diff and GI pathogen panel from 2/11 Colonoscopy 2/17 with severe pancolitis ulcerative colitis 2/17 bx with focal acute colitis, active chronic colitis with architectural changes c/w ulcerative colitis, active chronic colitis with architectural changes c/w ulcerative colitis Transitioned to oral steroids - will discharge with 40 mg daily at discharge per GI, long term dosing per Dr. [name] S/p first dose of infliximib on 2/23 Appreciate Gi assistance   Sinus tachycardia Related to dehydration  with poor PO intake vs anxiety?  No CP or SOB or LE edema EKG with sinus tach, no priors for comparison TSH wnl Good response to IVF with HR into the 60's Mildly tachy again today with more tachy when he was up and walking, improved again with IVF Encouraged PO intake at discharge   Cystitis He reported dysuria, and small amount of purulent drainage from penis at presentation CT with mild diffuse bladder wall thickening UA bland, not c/w UTI GC/chlamydia negative S/p 3 days ceftriaxone  Hyperbilirubinemia Some fluctuation, trend Follow outpatient  Asthma, chronic No  recent exacerbation.  As needed albuterol. Stable.  Iron deficiency anemia due to chronic blood loss He probably has chronic blood loss from colitis.  Iron level low at 27, ferritin 45.  Started  Iron supplement ordered at discharge, but will instruct him to start this only when UC symptoms resolved B12 is wnl Follow folate level wnl   Procedures: Egd - Preparation of the colon was fair. - The examined portion of the ileum was normal. - Severe (Mayo Score 3) pancolitis ulcerative colitis, worsened since the last examination. - Multiple biopsies were obtained in the rectum, in the sigmoid colon, in the descending colon, in the transverse colon and in the ascending colon. - Return patient to hospital ward for ongoing care. - Full liquid diet. - Continue present medications. - Use Remicade (infliximab) intravenous infusion: 5 mg / kg IV once per week at week 0, week 2, and week 6.  Consultations: GI  Discharge Exam: Vitals:   06/24/21 1050 06/24/21 1346  BP: 112/70 107/63  Pulse: (!) 118 98  Resp:  14  Temp: 98.4 F (36.9 C) 98.9 F (37.2 C)  SpO2: 100% 100%   No new  complaints Denies CP or SOB  General: No acute distress. Cardiovascular: regular, tachy (improved on tele after IVF) Lungs: unlabored Abdomen: Soft, nontender, nondistended Neurological: Alert and oriented 3. Moves all  extremities 4 . Cranial nerves II through XII grossly intact. Skin: Warm and dry. No rashes or lesions. Extremities: No clubbing or cyanosis. No edema.   Discharge Instructions   Discharge Instructions     Call MD for:  difficulty breathing, headache or visual disturbances   Complete by: As directed    Call MD for:  extreme fatigue   Complete by: As directed    Call MD for:  hives   Complete by: As directed    Call MD for:  persistant dizziness or light-headedness   Complete by: As directed    Call MD for:  persistant nausea and vomiting   Complete by: As directed    Call MD for:  redness, tenderness, or signs of infection (pain, swelling, redness, odor or green/yellow discharge around incision site)   Complete by: As directed    Call MD for:  severe uncontrolled pain   Complete by: As directed    Call MD for:  temperature >100.4   Complete by: As directed    Diet - low sodium heart healthy   Complete by: As directed    Discharge instructions   Complete by: As directed    You were seen for an ulcerative colitis flare.  You've improved with steroids and have been started on remicade.  Follow up with [name] G[facility]enterology as an outpatient.  Dr. [name] will give you instructions on what to do with your steroid dose in the future.    You've had some tachycardia (fast heart rate over the last day or so) which has improved with fluids.  Do your best to keep up with your hydration and food intake.  I'd expect the fast heart rate to improve as your better able to keep up with your intake with improving symptoms.  Follow up with your PCP within [name] week or so.  Follow up with gastroenterology as scheduled.  You have iron deficiency anemia, I'll discharge you with [name] prescription for iron, but sometimes this can cause constipation and GI symptoms, so hold off on taking this until your GI symptoms from your ulcerative colitis are under control.    Return for new, recurrent, or  worsening symptoms.  Please ask your PCP to request records from this hospitalization so they know what was done and what the next steps will be.   Increase activity slowly   Complete by: As directed       Allergies as of 06/24/2021       Reactions   Penicillins Rash   Other Rash   Nuts - hazelnuts.        Medication List     STOP taking these medications    mesalamine 1.2 g EC tablet Commonly known as: LIALDA   ondansetron 4 MG tablet Commonly known as: ZOFRAN   sucralfate 1 g tablet Commonly known as: Carafate       TAKE these medications    cetirizine 10 MG tablet Commonly known as: ZYRTEC Take 10 mg by mouth daily as needed for allergies.   ferrous sulfate 325 (65 FE) MG tablet Take 1 tablet (325 mg total) by mouth daily. For iron deficiency.  Take this only after your UC symptoms are under control (iron can cause constipation and stomach upset).   Flinstones Gummies Omega-3 DHA Chew Chew 2  tablets by mouth daily.   ondansetron 4 MG disintegrating tablet Commonly known as: ZOFRAN-ODT Take 1 tablet (4 mg total) by mouth every 8 (eight) hours as needed for nausea or vomiting. What changed: when to take this   pantoprazole 40 MG tablet Commonly known as: PROTONIX Take 1 tablet (40 mg total) by mouth daily.   predniSONE 20 MG tablet Commonly known as: DELTASONE Take 2 tablets (40 mg total) by mouth daily with breakfast. Follow up with [facility]ology for steroid instructions Start taking on: June 25, 2021 What changed:  how much to take additional instructions       Allergies  Allergen Reactions   Penicillins Rash   Other Rash    Nuts - hazelnuts.      The results of significant diagnostics from this hospitalization (including imaging, microbiology, ancillary and laboratory) are listed below for reference.    Significant Diagnostic Studies: CT ABDOMEN PELVIS W CONTRAST  Result Date: 06/11/2021 CLINICAL DATA:  Inflammatory  bowel disease (IBD) concern for entero-vesical fistula EXAM: CT ABDOMEN AND PELVIS WITH CONTRAST TECHNIQUE: Multidetector CT imaging of the abdomen and pelvis was performed using the standard protocol following bolus administration of intravenous contrast. RADIATION DOSE REDUCTION: This exam was performed according to the departmental dose-optimization program which includes automated exposure control, adjustment of the mA and/or kV according to patient size and/or use of iterative reconstruction technique. CONTRAST:  166mL OMNIPAQUE IOHEXOL 300 MG/ML  SOLN COMPARISON:  12/15/2020 FINDINGS: Lower chest: Lung bases are clear.  Heart size is normal. Hepatobiliary: No focal liver abnormality is seen. No gallstones, gallbladder wall thickening, or biliary dilatation. Pancreas: Unremarkable. No pancreatic ductal dilatation or surrounding inflammatory changes. Spleen: Normal in size without focal abnormality. Adrenals/Urinary Tract: Unremarkable adrenal glands. Kidneys enhance symmetrically without focal lesion, stone, or hydronephrosis. Ureters are nondilated. Mild diffuse urinary bladder wall thickening. No air is seen within the bladder lumen. No obvious colovesical fistula. Stomach/Bowel: Long segment bowel wall thickening with mucosal hyperenhancement involving the distal transverse colon, descending colon, sigmoid colon, and rectum. Normal appendix in the right lower quadrant. No dilated loops of bowel to suggest obstruction. Stomach within normal limits. Vascular/Lymphatic: No significant vascular findings are present. No enlarged abdominal or pelvic lymph nodes. Reproductive: Prostate is unremarkable. Other: No free fluid. No abdominopelvic fluid collection. No pneumoperitoneum. No abdominal wall hernia. Musculoskeletal: No acute or significant osseous findings. IMPRESSION: 1. Long segment bowel wall thickening with mucosal hyperenhancement involving the distal transverse colon, descending colon, sigmoid colon,  and rectum. Findings suggest active bowel inflammation in the setting of known inflammatory bowel disease. 2. Mild diffuse urinary bladder wall thickening, which may represent cystitis. No obvious colovesical fistula. Correlate with urinalysis. If further imaging evaluation is warranted, Djibril Glogowski CT pelvis with rectal contrast could be performed. Electronically Signed   By: Davina Poke D.O.   On: 06/11/2021 12:03   NM Hepato W/EF  Result Date: 06/16/2021 CLINICAL DATA:  Nausea and vomiting. EXAM: NUCLEAR MEDICINE HEPATOBILIARY IMAGING WITH GALLBLADDER EF TECHNIQUE: Sequential images of the abdomen were obtained out to 60 minutes following intravenous administration of radiopharmaceutical. After slow intravenous infusion of 1.2 mcg Cholecystokinin, gallbladder ejection fraction was determined. RADIOPHARMACEUTICALS:  5.5 mCi Tc-45m Choletec IV COMPARISON:  CT abdomen pelvis dated June 11, 2021. FINDINGS: Prompt uptake and biliary excretion of activity by the liver is seen. Gallbladder activity is visualized, consistent with patency of cystic duct. Biliary activity passes into small bowel, consistent with patent common bile duct. Calculated gallbladder ejection fraction is 42%. (  At 60 min, normal ejection fraction is greater than 40%.) IMPRESSION: Normal hepatobiliary scan and gallbladder ejection fraction. Electronically Signed   By: Titus Dubin M.D.   On: 06/16/2021 15:47   DG CHEST PORT 1 VIEW  Result Date: 06/17/2021 CLINICAL DATA:  Screening for pulmonary TB. EXAM: PORTABLE CHEST 1 VIEW COMPARISON:  04/22/2007 FINDINGS: The heart size and mediastinal contours are within normal limits. Both lungs are clear. The visualized skeletal structures are unremarkable. IMPRESSION: No active disease. Electronically Signed   By: Misty Stanley M.D.   On: 06/17/2021 13:33   DG Abd 2 Views  Result Date: 06/12/2021 CLINICAL DATA:  Abdominal pain and nausea. EXAM: ABDOMEN - 2 VIEW COMPARISON:  Radiograph dated  04/23/2007.  CT dated 12/15/2020. FINDINGS: The bowel gas pattern is normal. There is no evidence of free air. No radio-opaque calculi or other significant radiographic abnormality is seen. IMPRESSION: Negative. Electronically Signed   By: Anner Crete M.D.   On: 06/12/2021 20:32    Microbiology: No results found for this or any previous visit (from the past 240 hour(s)).   Labs: Basic Metabolic Panel: Recent Labs  Lab 06/19/21 0410 06/21/21 0414 06/22/21 0431 06/23/21 0430 06/24/21 0500  NA 132* 136 136 135 136  K 3.8 4.1 3.8 3.7 3.4*  CL 100 105 106 100 103  CO2 26 25 26 30 29   GLUCOSE 115* 127* 102* 111* 96  BUN 21* 21* 19 19 15   CREATININE 0.85 0.71 0.76 0.86 0.81  CALCIUM 8.3* 8.4* 8.2* 8.4* 7.9*  MG  --   --   --  2.4 2.3  PHOS 2.8  --   --  3.9 2.6   Liver Function Tests: Recent Labs  Lab 06/18/21 0536 06/19/21 0410 06/21/21 0414 06/22/21 0431 06/23/21 0430 06/24/21 0500  AST 13*  --  14* 16 20 16   ALT 13  --  20 21 25 20   ALKPHOS 57  --  52 53 61 46  BILITOT 2.4*  --  1.3* 1.1 1.7* 1.3*  PROT 6.5  --  5.8* 5.6* 6.5 4.8*  ALBUMIN 3.6 3.1* 3.1* 3.0* 3.3* 2.5*   No results for input(s): LIPASE, AMYLASE in the last 168 hours. No results for input(s): AMMONIA in the last 168 hours. CBC: Recent Labs  Lab 06/18/21 0536 06/22/21 0431 06/23/21 0430 06/24/21 0500  WBC 9.6 11.3* 7.3 6.0  NEUTROABS 7.6 9.3* 6.0 4.1  HGB 13.4 12.6* 13.8 10.4*  HCT 38.8* 35.3* 39.7 29.6*  MCV 81.7 81.1 82.2 82.9  PLT 274 230 220 163   Cardiac Enzymes: No results for input(s): CKTOTAL, CKMB, CKMBINDEX, TROPONINI in the last 168 hours. BNP: BNP (last 3 results) No results for input(s): BNP in the last 8760 hours.  ProBNP (last 3 results) No results for input(s): PROBNP in the last 8760 hours.  CBG: No results for input(s): GLUCAP in the last 168 hours.     Signed:  Fayrene Helper MD.  [facility] 06/24/2021, 3:44 PM

## 2021-06-24 NOTE — Progress Notes (Signed)
Pt discharged with electronics and clothes.  Discharge instructions given to pt and father.  Taken to front entrance in wheelchair.

## 2021-06-24 NOTE — Progress Notes (Signed)
°  Transition of Care [facility]) Screening Note   Patient Details  Name: Gregory Ruiz Date of Birth: 2000-11-20   Transition of Care [facility]) CM/SW Contact:    Lanier Clam, RN Phone Number: 06/24/2021, 3:39 PM    Transition of Care Department [facility]) has reviewed patient and no TOC needs have been identified at this time. We will continue to monitor patient advancement through interdisciplinary progression rounds. If new patient transition needs arise, please place a TOC consult.

## 2021-08-02 ENCOUNTER — Encounter (HOSPITAL_COMMUNITY): Payer: Self-pay

## 2021-08-02 ENCOUNTER — Emergency Department (HOSPITAL_COMMUNITY)
Admission: EM | Admit: 2021-08-02 | Discharge: 2021-08-02 | Disposition: A | Discharge status: Home / Self Care | Payer: BC Managed Care – PPO | Attending: Emergency Medicine | Admitting: Emergency Medicine

## 2021-08-02 ENCOUNTER — Other Ambulatory Visit: Payer: Self-pay

## 2021-08-02 ENCOUNTER — Emergency Department (HOSPITAL_COMMUNITY): Payer: BC Managed Care – PPO

## 2021-08-02 DIAGNOSIS — N202 Calculus of kidney with calculus of ureter: Secondary | ICD-10-CM | POA: Diagnosis not present

## 2021-08-02 DIAGNOSIS — N200 Calculus of kidney: Secondary | ICD-10-CM

## 2021-08-02 DIAGNOSIS — R1031 Right lower quadrant pain: Secondary | ICD-10-CM | POA: Diagnosis present

## 2021-08-02 LAB — CBC
HCT: 35.6 % — ABNORMAL LOW (ref 39.0–52.0)
Hemoglobin: 11.6 g/dL — ABNORMAL LOW (ref 13.0–17.0)
MCH: 26.9 pg (ref 26.0–34.0)
MCHC: 32.6 g/dL (ref 30.0–36.0)
MCV: 82.6 fL (ref 80.0–100.0)
Platelets: 208 10*3/uL (ref 150–400)
RBC: 4.31 MIL/uL (ref 4.22–5.81)
RDW: 12.1 % (ref 11.5–15.5)
WBC: 8.4 10*3/uL (ref 4.0–10.5)
nRBC: 0 % (ref 0.0–0.2)

## 2021-08-02 LAB — URINALYSIS, ROUTINE W REFLEX MICROSCOPIC
Bacteria, UA: NONE SEEN
Bilirubin Urine: NEGATIVE
Glucose, UA: NEGATIVE mg/dL
Ketones, ur: NEGATIVE mg/dL
Leukocytes,Ua: NEGATIVE
Nitrite: NEGATIVE
Protein, ur: NEGATIVE mg/dL
Specific Gravity, Urine: 1.019 (ref 1.005–1.030)
pH: 7 (ref 5.0–8.0)

## 2021-08-02 LAB — BASIC METABOLIC PANEL
Anion gap: 7 (ref 5–15)
BUN: 22 mg/dL — ABNORMAL HIGH (ref 6–20)
CO2: 26 mmol/L (ref 22–32)
Calcium: 8.6 mg/dL — ABNORMAL LOW (ref 8.9–10.3)
Chloride: 106 mmol/L (ref 98–111)
Creatinine, Ser: 1.03 mg/dL (ref 0.61–1.24)
GFR, Estimated: >60 mL/min (ref >60)
Glucose, Bld: 110 mg/dL — ABNORMAL HIGH (ref 70–99)
Potassium: 3.7 mmol/L (ref 3.5–5.1)
Sodium: 139 mmol/L (ref 135–145)

## 2021-08-02 MED ORDER — HYDROCODONE-ACETAMINOPHEN 5-325 MG PO TABS: 1.0000 | ORAL_TABLET | Freq: Four times a day (QID) | ORAL | 0 refills | Status: AC | PRN

## 2021-08-02 MED ORDER — ONDANSETRON 4 MG PO TBDP: 4.0000 mg | ORAL_TABLET | Freq: Three times a day (TID) | ORAL | 0 refills | Status: AC | PRN

## 2021-08-02 NOTE — ED Triage Notes (Signed)
Pt reports with sharp right flank pain upon waking. Pt reports getting infusions for his ulcerative colitis x 1 month.  ?

## 2021-08-02 NOTE — ED Provider Notes (Signed)
?[facility]-EMERGENCY DEPT ?Provider Note ? ? ?CSN: [id number] ?Arrival date & time: 08/02/21  0211 ? ?  ? ?History ? ?Chief Complaint  ?Patient presents with  ? Flank Pain  ? ? ?Gregory Ruiz is a 21 y.o. male. ? ?HPI ?Patient is a 21 year old male with a history of ulcerative colitis who presents to the emergency department due to right flank and abdominal pain that started about 1 hour ago.  Patient states that he went to bed feeling normal last night.  Around 1:15 AM he woke up with his pain.  He states that it was waxing and waning and has almost completely resolved.  No nausea, vomiting, diarrhea, dysuria, hematuria.  He does note that his urine appeared cloudier than normal.  No history of kidney stones but states that both of his parents suffer from kidney stones. ?  ? ?Home Medications ?Prior to Admission medications   ?Medication Sig Start Date End Date Taking? Authorizing Provider  ?HYDROcodone-acetaminophen (NORCO/VICODIN) 5-325 MG tablet Take 1 tablet by mouth every 6 (six) hours as needed. 08/02/21  Yes Placido Sou, PA-C  ?ondansetron (ZOFRAN-ODT) 4 MG disintegrating tablet Take 1 tablet (4 mg total) by mouth every 8 (eight) hours as needed for nausea or vomiting. 08/02/21  Yes Placido Sou, PA-C  ?cetirizine (ZYRTEC) 10 MG tablet Take 10 mg by mouth daily as needed for allergies.    [provider]  ?ferrous sulfate 325 (65 FE) MG tablet Take 1 tablet (325 mg total) by mouth daily. For iron deficiency.  Take this only after your UC symptoms are under control (iron can cause constipation and stomach upset). 06/24/21 07/24/21  Zigmund Daniel., MD  ?pantoprazole (PROTONIX) 40 MG tablet Take 1 tablet (40 mg total) by mouth daily. 06/24/21 07/24/21  Zigmund Daniel., MD  ?Pediatric Multiple Vitamins (FLINSTONES GUMMIES OMEGA-3 DHA) CHEW Chew 2 tablets by mouth daily.    [provider]  ?   ? ?Allergies    ?Penicillins and Other   ? ?Review of Systems    ?Review of Systems  ?All other systems reviewed and are negative. ?Ten systems reviewed and are negative for acute change, except as noted in the HPI.   ?Physical Exam ?Updated Vital Signs ?BP 121/74   Pulse 70   Temp 98.4 ?F (36.9 ?C) (Oral)   Resp 16   Ht 5\' 7"  (1.702 m)   Wt 61.2 kg   SpO2 100%   BMI 21.14 kg/m?  ?Physical Exam ?Vitals and nursing note reviewed.  ?Constitutional:   ?   General: He is not in acute distress. ?   Appearance: Normal appearance. He is not ill-appearing, toxic-appearing or diaphoretic.  ?HENT:  ?   Head: Normocephalic and atraumatic.  ?   Right Ear: External ear normal.  ?   Left Ear: External ear normal.  ?   Nose: Nose normal.  ?   Mouth/Throat:  ?   Mouth: Mucous membranes are moist.  ?   Pharynx: Oropharynx is clear. No oropharyngeal exudate or posterior oropharyngeal erythema.  ?Eyes:  ?   Extraocular Movements: Extraocular movements intact.  ?Cardiovascular:  ?   Rate and Rhythm: Normal rate and regular rhythm.  ?   Pulses: Normal pulses.  ?   Heart sounds: Normal heart sounds. No murmur heard. ?  No friction rub. No gallop.  ?Pulmonary:  ?   Effort: Pulmonary effort is normal. No respiratory distress.  ?   Breath sounds: Normal breath sounds. No  stridor. No wheezing, rhonchi or rales.  ?Abdominal:  ?   General: Abdomen is flat.  ?   Palpations: Abdomen is soft.  ?   Tenderness: There is no abdominal tenderness. There is no right CVA tenderness or left CVA tenderness.  ?   Comments: Abdomen is flat and soft.  Mild tenderness noted to the right central lateral abdomen.  No CVA tenderness.  ?Musculoskeletal:     ?   General: Normal range of motion.  ?   Cervical back: Normal range of motion and neck supple. No tenderness.  ?Skin: ?   General: Skin is warm and dry.  ?Neurological:  ?   General: No focal deficit present.  ?   Mental Status: He is alert and oriented to person, place, and time.  ?Psychiatric:     ?   Mood and Affect: Mood normal.     ?   Behavior: Behavior  normal.  ? ?ED Results / Procedures / Treatments   ?Labs ?(all labs ordered are listed, but only abnormal results are displayed) ?Labs Reviewed  ?URINALYSIS, ROUTINE W REFLEX MICROSCOPIC - Abnormal; Notable for the following components:  ?    Result Value  ? APPearance CLOUDY (*)   ? Hgb urine dipstick SMALL (*)   ? All other components within normal limits  ?BASIC METABOLIC PANEL - Abnormal; Notable for the following components:  ? Glucose, Bld 110 (*)   ? BUN 22 (*)   ? Calcium 8.6 (*)   ? All other components within normal limits  ?CBC - Abnormal; Notable for the following components:  ? Hemoglobin 11.6 (*)   ? HCT 35.6 (*)   ? All other components within normal limits  ? ?EKG ?None ? ?Radiology ?CT Renal Stone Study ? ?Result Date: 08/02/2021 ?CLINICAL DATA:  Flank pain with kidney stone suspected. Right flank pain EXAM: CT ABDOMEN AND PELVIS WITHOUT CONTRAST TECHNIQUE: Multidetector CT imaging of the abdomen and pelvis was performed following the standard protocol without IV contrast. RADIATION DOSE REDUCTION: This exam was performed according to the departmental dose-optimization program which includes automated exposure control, adjustment of the mA and/or kV according to patient size and/or use of iterative reconstruction technique. COMPARISON:  06/11/2021 FINDINGS: Lower chest:  No contributory findings. Hepatobiliary: Partial coverage of the liver is negative.No evidence of biliary obstruction or stone. Pancreas: Unremarkable. Spleen: Partial coverage is negative Adrenals/Urinary Tract: Negative adrenals. Right hydronephrosis and upper hydroureter due to 4 mm ureteral stone just above the iliac crossing (L4-5 level). Punctate right lower pole renal calculus on reformats. Negative bladder. Stomach/Bowel:  No obstruction. No appendicitis. Vascular/Lymphatic: No acute vascular abnormality. No mass or adenopathy. Reproductive:No pathologic findings. Other: No ascites or pneumoperitoneum. Musculoskeletal: No  acute abnormalities. IMPRESSION: 1. Right hydronephrosis from a 4 mm ureteric stone just above the iliac vessel crossing. 2. Punctate right nephrolithiasis. Electronically Signed   By: Tiburcio Pea M.D.   On: 08/02/2021 04:00   ? ?Procedures ?Procedures  ? ?Medications Ordered in ED ?Medications - No data to display ? ?ED Course/ Medical Decision Making/ A&P ?Clinical Course as of 08/02/21 0436  ?Tue Aug 02, 2021  ?0331 Urinalysis, Routine w reflex microscopic(!) ?UA shows small hemoglobin as well as 11-20 red blood cells.  Given patient's episode of flank pain earlier tonight we will obtain a CT renal stone study. [LJ]  ?  ?Clinical Course User Index ?[LJ] Placido Sou, PA-C  ? ?                        ?  Medical Decision Making ?Amount and/or Complexity of Data Reviewed ?Labs: ordered. Decision-making details documented in ED Course. ?Radiology: ordered. ? ?Risk ?Prescription drug management. ? ?Pt is a 21 y.o. male with history of ulcerative colitis who presents to the emergency department due to right-sided abdominal pain that started earlier tonight. ? ?Labs: ?CBC with a hemoglobin of 11.6 and a hematocrit of 35.6. ?BMP with glucose of 110, BUN of 22, calcium of 8.6. ?UA with small hemoglobin and 11-20 red blood cells. ? ?Imaging: ?CT renal stone study shows right hydronephrosis from a 4 mm ureteral stone just above the iliac vessel crossing.  Punctate right nephrolithiasis. ? ?I, Placido SouLogan Dorene Bruni, PA-C, personally reviewed and evaluated these images and lab results as part of my medical decision-making. ? ?After arriving to the ED patient states his symptoms have almost completely resolved.  Current pain is about a 1-2.  On my exam abdomen is flat and soft.  He has mild tenderness noted to the right central lateral abdomen.  No CVA tenderness.  Patient otherwise denying any complaints.  Afebrile, nontachycardic, and nontoxic-appearing. ? ?CBC with a hemoglobin of 11.6.  No leukocytosis.  Hemoglobin appears  stable when compared to priors.  BMP with glucose of 110, BUN of 22, calcium of 8.6.  UA does not appear infectious.  Given the hematuria on UA I obtained a CT renal stone study with findings as noted above.  Concerning

## 2021-08-02 NOTE — Discharge Instructions (Addendum)
I have prescribed you a strong narcotic called vicodin. Please only take this as prescribed. This medication also has tylenol in it, so please be sure you are not taking more than 3000 mg of tylenol per day. Do not drive or operate heavy machinery after taking this medication. Do not mix it with alcohol.  ? ?I am prescribing you a medication called Zofran.  This is a disintegrating tablet you can use up to 3 times a day for management of your nausea and vomiting.  Please only take this as prescribed.  Please only take this if you are experiencing nausea and vomiting that you cannot control. ? ?Below is the contact information for [facility].  Please give them a call your convenience to schedule an appointment for reevaluation.  If you develop any new or worsening symptoms please come back to the emergency department. ?

## 2021-08-02 NOTE — ED Notes (Signed)
Pt. Assisted to the bathroom for urine sample ? ?

## 2022-11-19 ENCOUNTER — Encounter (HOSPITAL_BASED_OUTPATIENT_CLINIC_OR_DEPARTMENT_OTHER): Payer: Self-pay

## 2022-11-19 ENCOUNTER — Emergency Department (HOSPITAL_BASED_OUTPATIENT_CLINIC_OR_DEPARTMENT_OTHER): Payer: BC Managed Care – PPO

## 2022-11-19 DIAGNOSIS — S60052A Contusion of left little finger without damage to nail, initial encounter: Secondary | ICD-10-CM | POA: Diagnosis not present

## 2022-11-19 DIAGNOSIS — S7002XA Contusion of left hip, initial encounter: Secondary | ICD-10-CM | POA: Diagnosis not present

## 2022-11-19 DIAGNOSIS — S79912A Unspecified injury of left hip, initial encounter: Secondary | ICD-10-CM | POA: Diagnosis present

## 2022-11-19 DIAGNOSIS — Y92410 Unspecified street and highway as the place of occurrence of the external cause: Secondary | ICD-10-CM | POA: Diagnosis not present
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
## 2022-11-20 ENCOUNTER — Emergency Department (HOSPITAL_BASED_OUTPATIENT_CLINIC_OR_DEPARTMENT_OTHER): Payer: BC Managed Care – PPO

## 2022-11-20 ENCOUNTER — Emergency Department (HOSPITAL_BASED_OUTPATIENT_CLINIC_OR_DEPARTMENT_OTHER)
Admission: EM | Admit: 2022-11-20 | Discharge: 2022-11-20 | Disposition: A | Discharge status: Home / Self Care | Payer: BC Managed Care – PPO | Attending: Emergency Medicine | Admitting: Emergency Medicine

## 2022-11-20 DIAGNOSIS — S60052A Contusion of left little finger without damage to nail, initial encounter: Secondary | ICD-10-CM

## 2022-11-20 DIAGNOSIS — S7002XA Contusion of left hip, initial encounter: Secondary | ICD-10-CM
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
## 2023-04-13 ENCOUNTER — Ambulatory Visit (INDEPENDENT_AMBULATORY_CARE_PROVIDER_SITE_OTHER): Payer: BC Managed Care – PPO | Admitting: Sports Medicine

## 2023-04-13 VITALS — BP 128/79 | HR 84 | Ht 67.0 in | Wt 132.0 lb

## 2023-04-13 DIAGNOSIS — Z0289 Encounter for other administrative examinations: Secondary | ICD-10-CM

## 2023-04-13 LAB — POCT URINALYSIS DIP (CLINITEK)
Bilirubin, UA: NEGATIVE
Blood, UA: NEGATIVE
Glucose, UA: NEGATIVE mg/dL
Ketones, POC UA: NEGATIVE mg/dL
Leukocytes, UA: NEGATIVE
Nitrite, UA: NEGATIVE
POC PROTEIN,UA: NEGATIVE
Spec Grav, UA: 1.01 (ref 1.010–1.025)
Urobilinogen, UA: 0.2 U/dL
pH, UA: 7 (ref 5.0–8.0)

## 2023-04-13 NOTE — Addendum Note (Signed)
Addended by: Carren Rang A on: 04/13/2023 04:38 PM   Modules accepted: Orders

## 2023-04-13 NOTE — Assessment & Plan Note (Signed)
Class I flight physical as above. Return in 1 year. CACI qualified ulcerative colitis.

## 2023-04-13 NOTE — Progress Notes (Signed)
    Procedures performed today:    None.  Independent interpretation of notes and tests performed by another provider:   None.  Brief History, Exam, Impression, and Recommendations:    Encounter for [employer]) examination Class I flight physical as above. Return in 1 year. CACI qualified ulcerative colitis.  I spent 30 minutes of total time managing this patient today, this includes chart review, face to face, and non-face to face time.  ____________________________________________ Ihor Austin. Benjamin Stain, M.D., ABFM., CAQSM., AME. Primary Care and Sports Medicine [facility]  Adjunct Professor of Family Medicine  [employer]  Restaurant manager, fast food

## 2023-04-19 ENCOUNTER — Encounter: Payer: BC Managed Care – PPO | Admitting: Sports Medicine

## 2024-01-01 ENCOUNTER — Encounter: Payer: Self-pay | Admitting: Sports Medicine

## 2024-01-23 IMAGING — CT CT RENAL STONE PROTOCOL
2 of 4 series · 16 of 46 positions shown, 18 images · non-contrast
Comparison: 06/11/2021

CLINICAL DATA: Flank pain with kidney stone suspected. Right flank
pain



[Series 2: axial st · axial · 0.70mm/px · z∈[+1084,+1439]mm · 13 of 79 slices shown, 15 images]
[im 4/79  soft-tissue]
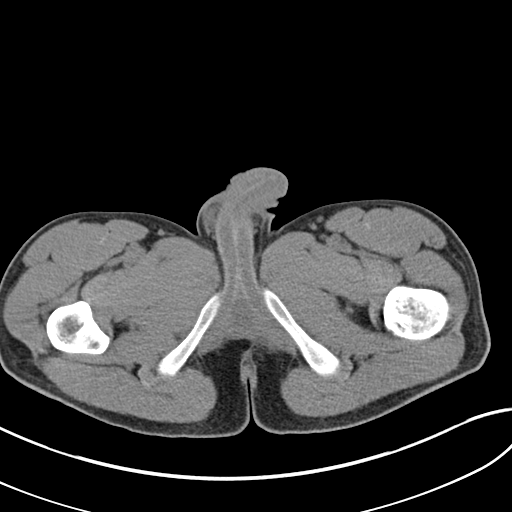
[im 4/79  bone]
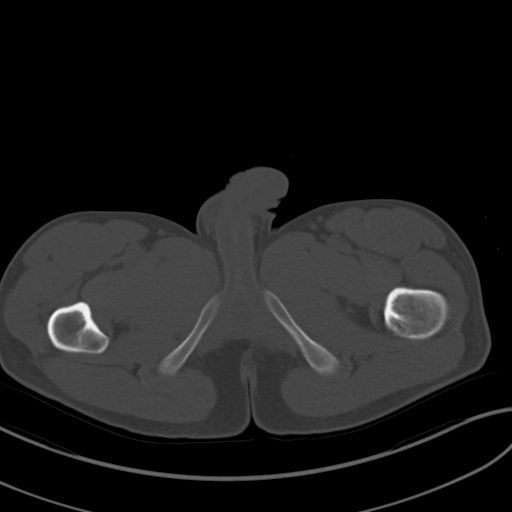
[im 10/79  soft-tissue]
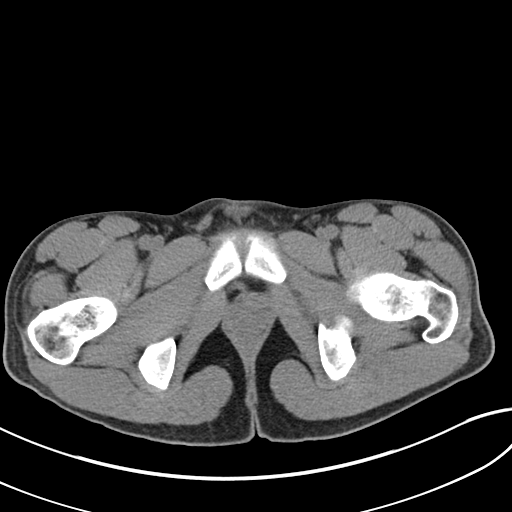
[im 17/79  soft-tissue]
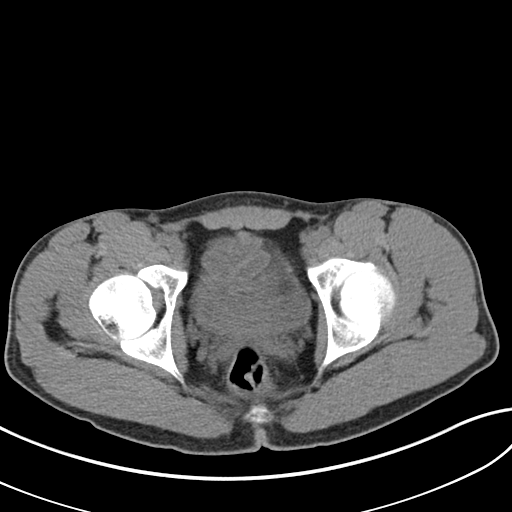
[im 23/79  soft-tissue]
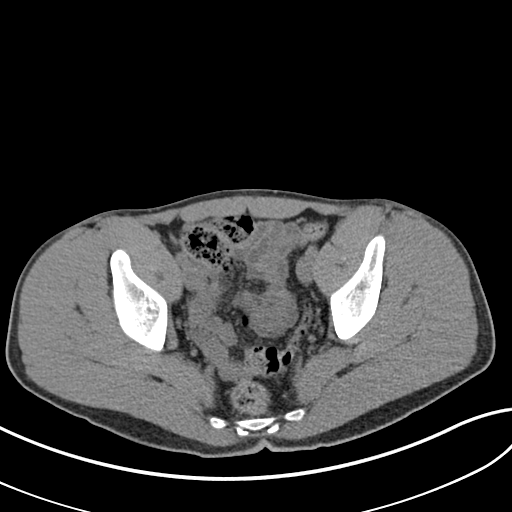
[im 27/79  soft-tissue]
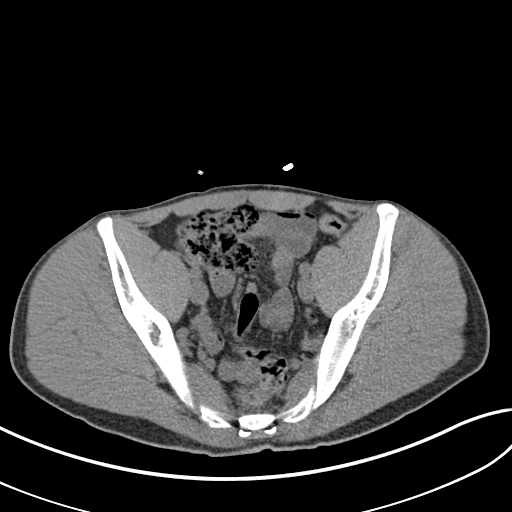
[im 33/79  soft-tissue]
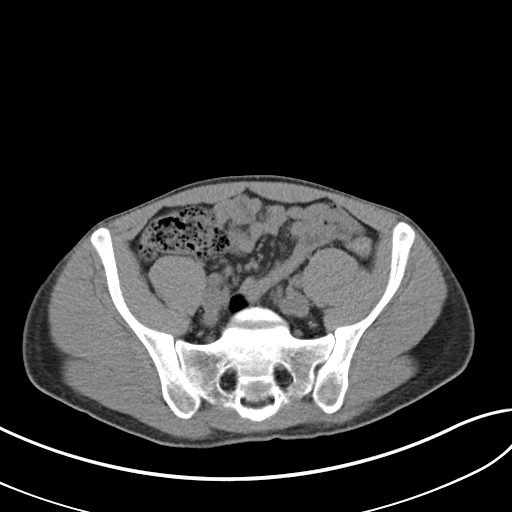
[im 40/79  soft-tissue]
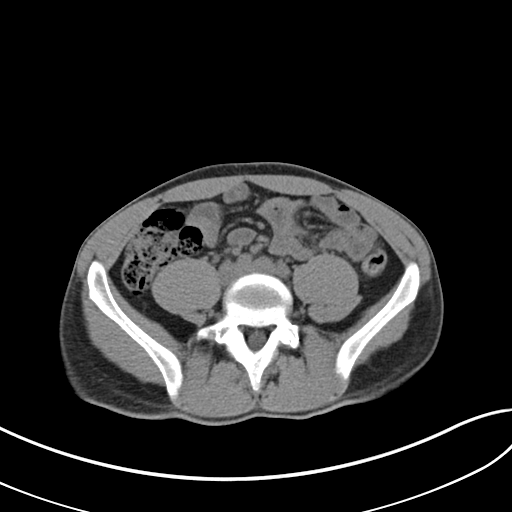
[im 46/79  soft-tissue]
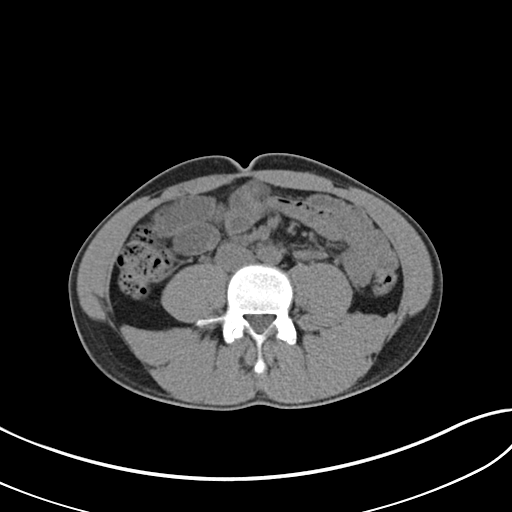
[im 53/79  soft-tissue]
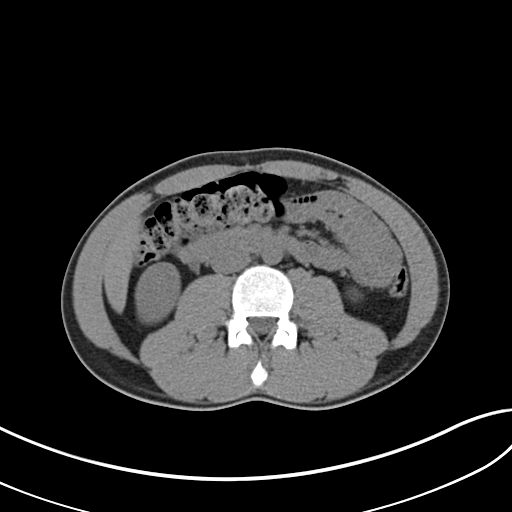
[im 53/79  bone]
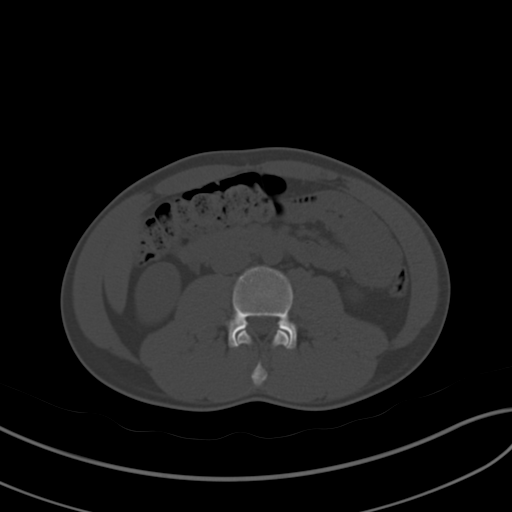
[im 56/79  soft-tissue]
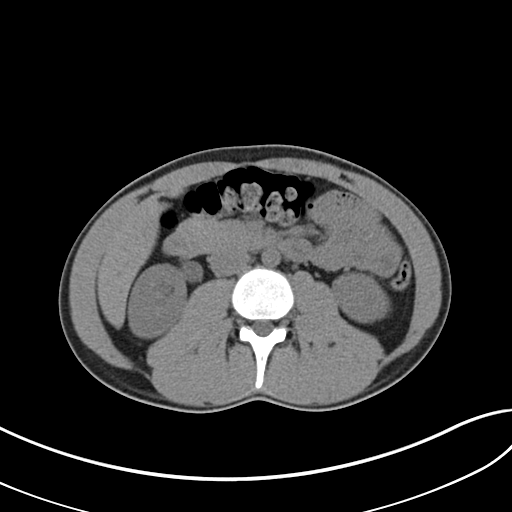
[im 62/79  soft-tissue]
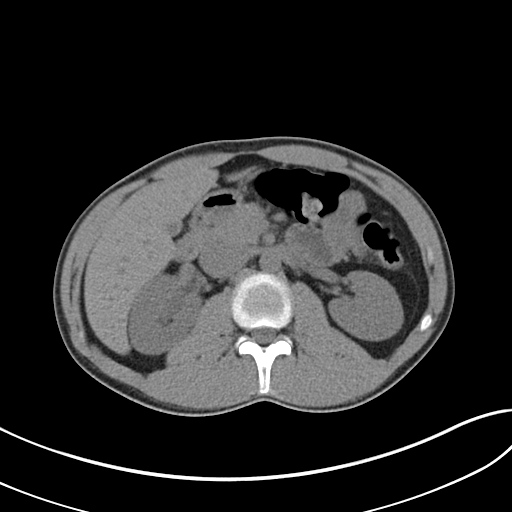
[im 69/79  soft-tissue]
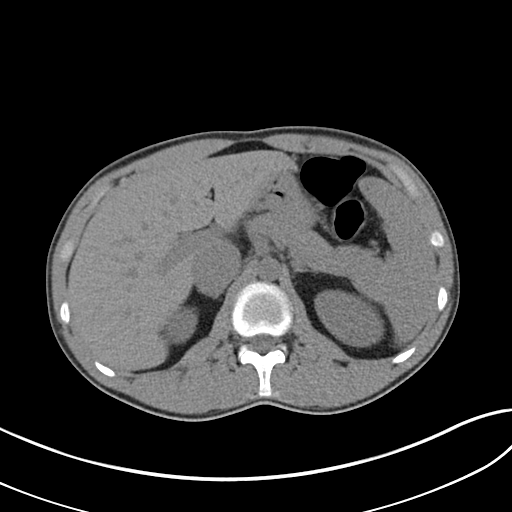
[im 75/79  soft-tissue]
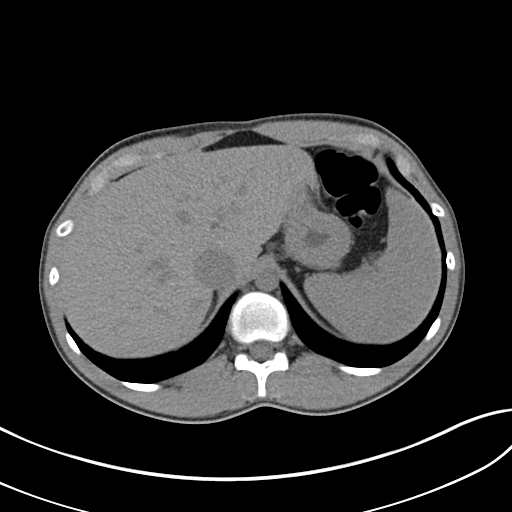

[Series 4: coronal · coronal · 0.62mm/px · 3 of 116 slices shown]
[im 39/116  soft-tissue]
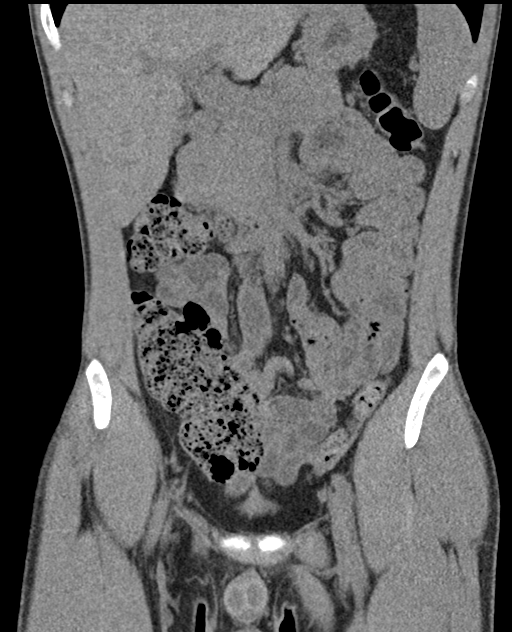
[im 52/116  soft-tissue]
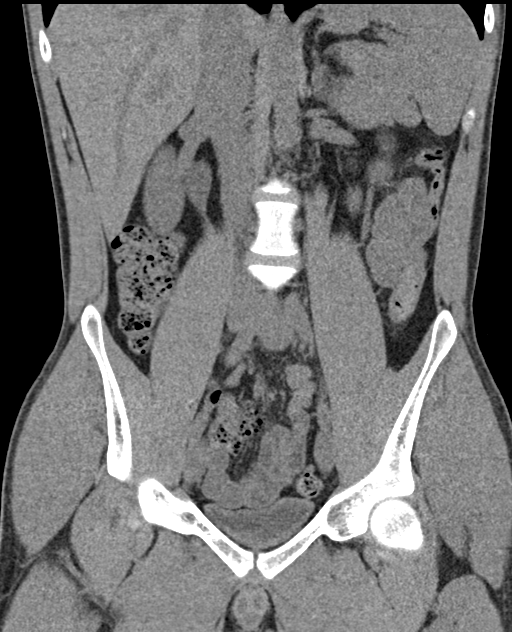
[im 64/116  soft-tissue]
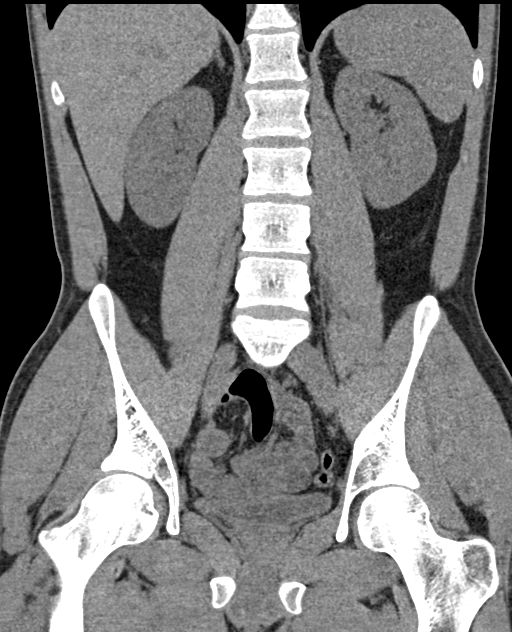

[16 of 46 positions shown; findings below may reference images not displayed]

FINDINGS: Lower chest:  No contributory findings.

Hepatobiliary: Partial coverage of the liver is negative.No evidence
of biliary obstruction or stone.

Pancreas: Unremarkable.

Spleen: Partial coverage is negative

Adrenals/Urinary Tract: Negative adrenals. Right hydronephrosis and
upper hydroureter due to 4 mm ureteral stone just above the iliac
crossing (L4-5 level). Punctate right lower pole renal calculus on
reformats. Negative bladder.

Stomach/Bowel:  No obstruction. No appendicitis.

Vascular/Lymphatic: No acute vascular abnormality. No mass or
adenopathy.

Reproductive:No pathologic findings.

Other: No ascites or pneumoperitoneum.

Musculoskeletal: No acute abnormalities.
IMPRESSION: 1. Right hydronephrosis from a 4 mm ureteric stone just above the
iliac vessel crossing.
2. Punctate right nephrolithiasis.
